# Patient Record
Sex: Female | Born: 1995 | Race: White | Hispanic: No | Marital: Single | Smoking: Never smoker
Health system: Southern US, Community
[De-identification: ages and names within clinical notes are randomized; demographics above are authoritative.]

## PROBLEM LIST (undated history)

## (undated) VITALS — BP 121/77 | HR 130 | Temp 98.1°F | Resp 16 | Ht 65.35 in | Wt 175.9 lb

## (undated) DIAGNOSIS — F429 Obsessive-compulsive disorder, unspecified: Secondary | ICD-10-CM

## (undated) DIAGNOSIS — F4310 Post-traumatic stress disorder, unspecified: Secondary | ICD-10-CM

## (undated) DIAGNOSIS — F329 Major depressive disorder, single episode, unspecified: Secondary | ICD-10-CM

## (undated) DIAGNOSIS — F32A Depression, unspecified: Secondary | ICD-10-CM

## (undated) DIAGNOSIS — F319 Bipolar disorder, unspecified: Secondary | ICD-10-CM

## (undated) DIAGNOSIS — H539 Unspecified visual disturbance: Secondary | ICD-10-CM

## (undated) HISTORY — PX: OTHER SURGICAL HISTORY: SHX169

---
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
## 2016-05-19 ENCOUNTER — Emergency Department (HOSPITAL_COMMUNITY)
Admission: EM | Admit: 2016-05-19 | Discharge: 2016-05-19 | Disposition: A | Discharge status: Home / Self Care | Payer: BLUE CROSS/BLUE SHIELD | Attending: Emergency Medicine | Admitting: Emergency Medicine

## 2016-05-19 ENCOUNTER — Encounter (HOSPITAL_COMMUNITY): Payer: Self-pay | Admitting: Emergency Medicine

## 2016-05-19 DIAGNOSIS — R45851 Suicidal ideations: Secondary | ICD-10-CM | POA: Diagnosis present

## 2016-05-19 DIAGNOSIS — F4321 Adjustment disorder with depressed mood: Secondary | ICD-10-CM

## 2016-05-19 DIAGNOSIS — Z8659 Personal history of other mental and behavioral disorders: Secondary | ICD-10-CM

## 2016-05-19 DIAGNOSIS — Z79899 Other long term (current) drug therapy: Secondary | ICD-10-CM | POA: Diagnosis not present

## 2016-05-19 HISTORY — DX: Obsessive-compulsive disorder, unspecified: F42.9

## 2016-05-19 HISTORY — DX: Bipolar disorder, unspecified: F31.9

## 2016-05-19 HISTORY — DX: Post-traumatic stress disorder, unspecified: F43.10

## 2016-05-19 LAB — COMPREHENSIVE METABOLIC PANEL
ALK PHOS: 84 U/L (ref 38–126)
ALT: 24 U/L (ref 14–54)
AST: 23 U/L (ref 15–41)
Albumin: 4.3 g/dL (ref 3.5–5.0)
Anion gap: 8 (ref 5–15)
BUN: 9 mg/dL (ref 6–20)
CO2: 24 mmol/L (ref 22–32)
CREATININE: 0.62 mg/dL (ref 0.44–1.00)
Calcium: 9.3 mg/dL (ref 8.9–10.3)
Chloride: 106 mmol/L (ref 101–111)
Glucose, Bld: 114 mg/dL — ABNORMAL HIGH (ref 65–99)
Potassium: 3.9 mmol/L (ref 3.5–5.1)
Sodium: 138 mmol/L (ref 135–145)
Total Bilirubin: <0.1 mg/dL — ABNORMAL LOW (ref 0.3–1.2)
Total Protein: 8.2 g/dL — ABNORMAL HIGH (ref 6.5–8.1)

## 2016-05-19 LAB — CBC
HCT: 41 % (ref 36.0–46.0)
Hemoglobin: 13.5 g/dL (ref 12.0–15.0)
MCH: 27.9 pg (ref 26.0–34.0)
MCHC: 32.9 g/dL (ref 30.0–36.0)
MCV: 84.7 fL (ref 78.0–100.0)
PLATELETS: 352 10*3/uL (ref 150–400)
RBC: 4.84 MIL/uL (ref 3.87–5.11)
RDW: 13.5 % (ref 11.5–15.5)
WBC: 12.7 10*3/uL — ABNORMAL HIGH (ref 4.0–10.5)

## 2016-05-19 LAB — ACETAMINOPHEN LEVEL: Acetaminophen (Tylenol), Serum: <10 ug/mL — ABNORMAL LOW (ref 10–30)

## 2016-05-19 LAB — RAPID URINE DRUG SCREEN, HOSP PERFORMED
Amphetamines: NOT DETECTED
Barbiturates: NOT DETECTED
Benzodiazepines: NOT DETECTED
Cocaine: NOT DETECTED
Opiates: NOT DETECTED
Tetrahydrocannabinol: NOT DETECTED

## 2016-05-19 LAB — I-STAT BETA HCG BLOOD, ED (MC, WL, AP ONLY): I-stat hCG, quantitative: <5 m[IU]/mL (ref <5)

## 2016-05-19 LAB — SALICYLATE LEVEL

## 2016-05-19 LAB — ETHANOL

## 2016-05-19 NOTE — ED Notes (Signed)
Patient discharged to home.  All belongings returned and signed for.  Left the unit ambulatory and was escorted to the front lobby.  Patient denies thoughts or harm to self or others.  She also denies auditory or visual hallucinations.

## 2016-05-19 NOTE — ED Triage Notes (Signed)
Pt states she never said she was suicidal and what she said was that she could not predict what the future held   Pt states when she was asked if she was suicidal she answered "I dont think so" but never said "No"  Pt states she never answers any question with a no

## 2016-05-19 NOTE — Consult Note (Signed)
[facility] Consult   Reason for Consult:  Sucidial ideations  Referring Physician:  EDP Patient Identification: Abigail Cantrell MRN:  [id number] Principal Diagnosis: Adjustment disorder with depressed mood Diagnosis:   Patient Active Problem List   Diagnosis Date Noted  . Adjustment disorder with depressed mood [F43.21] 05/19/2016    Priority: High  . Insomnia [G47.00] 11/30/2012  . OCD (obsessive compulsive disorder) [F42.9] 08/22/2012    Total Time spent with patient: 45 minutes  Subjective:   Abigail Cantrell is a 20 y.o. female patient does not warrant admission.  HPI:  20 yo female brought to the ED for suicidal ideations which she denies along with past attempts.  She reports she got upset yesterday but was not suicidal.  No homicidal ideations, hallucinations, and alcohol/drug abuse.  She has an appointment with Beverly Sessions already in place.  Stable for discharge.  Past Psychiatric History: OCD, anxiety  Risk to Self: Suicidal Ideation: No Suicidal Intent: No Is patient at risk for suicide?: No Suicidal Plan?: No Access to Means: No What has been your use of drugs/alcohol within the last 12 months?: Pt denies alcohol/drug use Other Self Harm Risks: h/o cutting Triggers for Past Attempts: Unpredictable Intentional Self Injurious Behavior: Cutting (scratching) Comment - Self Injurious Behavior: Pt reports h/o cutting. Pt states she has not cut "for a couple months". Pt scratched self pta Risk to Others: Homicidal Ideation: No Thoughts of Harm to Others: No Current Homicidal Intent: No Current Homicidal Plan: No Access to Homicidal Means: No History of harm to others?: No Assessment of Violence: None Noted Violent Behavior Description: n Does patient have access to weapons?: No Criminal Charges Pending?: No Does patient have a court date: No Prior Inpatient Therapy: Prior Inpatient Therapy: Yes Prior Therapy Dates: 1.2014 Prior Therapy  Facilty/Provider(s): [facility] Reason for Treatment: SI Prior Outpatient Therapy: Prior Outpatient Therapy: Yes Prior Therapy Dates: Multiple Dates and providers since childhood Prior Therapy Facilty/Provider(s): Multiple dates and providers since childhood Reason for Treatment: bipolar disorder  Does patient have an ACCT team?: No Does patient have Intensive In-House Services?  : No Does patient have [facility] services? : Yes Does patient have [facility] services?: No  Past Medical History:  Past Medical History:  Diagnosis Date  . Bipolar disorder ([place])   . Depression   . OCD (obsessive compulsive disorder)   . PTSD (post-traumatic stress disorder)   . Vision abnormalities     Past Surgical History:  Procedure Laterality Date  . estraction of wisdom teeth     Family History: History reviewed. No pertinent family history. Family Psychiatric  History: none Social History:  History  Alcohol Use No     History  Drug Use No    Social History   Social History  . Marital status: Single    Spouse name: N/A  . Number of children: N/A  . Years of education: N/A   Social History Main Topics  . Smoking status: Never Smoker  . Smokeless tobacco: Never Used  . Alcohol use No  . Drug use: No  . Sexual activity: Yes    Birth control/ protection: None     Comment: pt has current gf   Other Topics Concern  . None   Social History Narrative  . None   Additional Social History:    Allergies:  No Known Allergies  Labs:  Results for orders placed or performed during the hospital encounter of 05/19/16 (from the past 48 hour(s))  Comprehensive metabolic panel  Status: Abnormal   Collection Time: 05/19/16  2:01 AM  Result Value Ref Range   Sodium 138 135 - 145 mmol/L   Potassium 3.9 3.5 - 5.1 mmol/L   Chloride 106 101 - 111 mmol/L   CO2 24 22 - 32 mmol/L   Glucose, Bld 114 (H) 65 - 99 mg/dL   BUN 9 6 - 20 mg/dL   Creatinine, Ser 0.62 0.44 - 1.00 mg/dL   Calcium 9.3 8.9 - 10.3  mg/dL   Total Protein 8.2 (H) 6.5 - 8.1 g/dL   Albumin 4.3 3.5 - 5.0 g/dL   AST 23 15 - 41 U/L   ALT 24 14 - 54 U/L   Alkaline Phosphatase 84 38 - 126 U/L   Total Bilirubin <0.1 (L) 0.3 - 1.2 mg/dL   GFR calc non Af Amer >60 >60 mL/min   GFR calc Af Amer >60 >60 mL/min    Comment: (NOTE) The eGFR has been calculated using the CKD EPI equation. This calculation has not been validated in all clinical situations. eGFR's persistently <60 mL/min signify possible Chronic Kidney Disease.    Anion gap 8 5 - 15  Ethanol     Status: None   Collection Time: 05/19/16  2:01 AM  Result Value Ref Range   Alcohol, Ethyl (B) <5 <5 mg/dL    Comment:        LOWEST DETECTABLE LIMIT FOR SERUM ALCOHOL IS 5 mg/dL FOR MEDICAL PURPOSES ONLY   Salicylate level     Status: None   Collection Time: 05/19/16  2:01 AM  Result Value Ref Range   Salicylate Lvl <1.6 2.8 - 30.0 mg/dL  Acetaminophen level     Status: Abnormal   Collection Time: 05/19/16  2:01 AM  Result Value Ref Range   Acetaminophen (Tylenol), Serum <10 (L) 10 - 30 ug/mL    Comment:        THERAPEUTIC CONCENTRATIONS VARY SIGNIFICANTLY. A RANGE OF 10-30 ug/mL MAY BE AN EFFECTIVE CONCENTRATION FOR MANY PATIENTS. HOWEVER, SOME ARE BEST TREATED AT CONCENTRATIONS OUTSIDE THIS RANGE. ACETAMINOPHEN CONCENTRATIONS >150 ug/mL AT 4 HOURS AFTER INGESTION AND >50 ug/mL AT 12 HOURS AFTER INGESTION ARE OFTEN ASSOCIATED WITH TOXIC REACTIONS.   cbc     Status: Abnormal   Collection Time: 05/19/16  2:01 AM  Result Value Ref Range   WBC 12.7 (H) 4.0 - 10.5 K/uL   RBC 4.84 3.87 - 5.11 MIL/uL   Hemoglobin 13.5 12.0 - 15.0 g/dL   HCT 41.0 36.0 - 46.0 %   MCV 84.7 78.0 - 100.0 fL   MCH 27.9 26.0 - 34.0 pg   MCHC 32.9 30.0 - 36.0 g/dL   RDW 13.5 11.5 - 15.5 %   Platelets 352 150 - 400 K/uL  I-Stat beta hCG blood, ED     Status: None   Collection Time: 05/19/16  2:29 AM  Result Value Ref Range   I-stat hCG, quantitative <5.0 <5 mIU/mL    Comment 3            Comment:   GEST. AGE      CONC.  (mIU/mL)   <=1 WEEK        5 - 50     2 WEEKS       50 - 500     3 WEEKS       100 - 10,000     4 WEEKS     1,000 - 30,000        FEMALE AND  NON-PREGNANT FEMALE:     LESS THAN 5 mIU/mL   Rapid urine drug screen (hospital performed)     Status: None   Collection Time: 05/19/16  2:40 AM  Result Value Ref Range   Opiates NONE DETECTED NONE DETECTED   Cocaine NONE DETECTED NONE DETECTED   Benzodiazepines NONE DETECTED NONE DETECTED   Amphetamines NONE DETECTED NONE DETECTED   Tetrahydrocannabinol NONE DETECTED NONE DETECTED   Barbiturates NONE DETECTED NONE DETECTED    Comment:        DRUG SCREEN FOR MEDICAL PURPOSES ONLY.  IF CONFIRMATION IS NEEDED FOR ANY PURPOSE, NOTIFY LAB WITHIN 5 DAYS.        LOWEST DETECTABLE LIMITS FOR URINE DRUG SCREEN Drug Class       Cutoff (ng/mL) Amphetamine      1000 Barbiturate      200 Benzodiazepine   614 Tricyclics       431 Opiates          300 Cocaine          300 THC              50     No current facility-administered medications for this encounter.    Current Outpatient Prescriptions  Medication Sig Dispense Refill  . ibuprofen (ADVIL,MOTRIN) 800 MG tablet Take 800 mg by mouth every 8 (eight) hours as needed for moderate pain.     Marland Kitchen lamoTRIgine (LAMICTAL) 150 MG tablet Take 150 mg by mouth at bedtime.  0  . ziprasidone (GEODON) 80 MG capsule Take 80 mg by mouth every evening.  1  . citalopram (CELEXA) 20 MG tablet Take 1 tablet (20 mg total) by mouth daily. (Patient not taking: Reported on 05/19/2016) 30 tablet 3  . meloxicam (MOBIC) 7.5 MG tablet Take 1 tablet (7.5 mg total) by mouth daily as needed (headache). (Patient not taking: Reported on 09/23/2014) 30 tablet 0  . QUEtiapine (SEROQUEL) 100 MG tablet ONE TABLET BY MOUTH AT BEDTIME (Patient not taking: Reported on 09/23/2014) 30 tablet 1  . Triamcinolone Acetonide (TRIAMCINOLONE 0.1 % CREAM : EUCERIN) CREA Apply 1 application  topically 2 (two) times daily. (Patient not taking: Reported on 09/23/2014) 60 each 2  . triamcinolone cream (KENALOG) 0.1 % Apply topically 2 (two) times daily. NEED VISIT! (Patient not taking: Reported on 09/23/2014) 60 g 0    Musculoskeletal: Strength & Muscle Tone: within normal limits Gait & Station: normal Patient leans: N/A  Psychiatric Specialty Exam: Physical Exam  Constitutional: She is oriented to person, place, and time. She appears well-developed and well-nourished.  HENT:  Head: Normocephalic.  Neck: Normal range of motion.  Respiratory: Effort normal.  Musculoskeletal: Normal range of motion.  Neurological: She is alert and oriented to person, place, and time.  Skin: Skin is warm and dry.  Psychiatric: Her speech is normal and behavior is normal. Judgment and thought content normal. Cognition and memory are normal. She exhibits a depressed mood.    Review of Systems  Constitutional: Negative.   HENT: Negative.   Eyes: Negative.   Respiratory: Negative.   Cardiovascular: Negative.   Gastrointestinal: Negative.   Genitourinary: Negative.   Musculoskeletal: Negative.   Skin: Negative.   Neurological: Negative.   Endo/Heme/Allergies: Negative.   Psychiatric/Behavioral: Positive for depression.    Blood pressure 110/56, pulse 104, temperature 98.1 F (36.7 C), temperature source Oral, resp. rate 18, height '5\' 6"'$  (1.676 m), weight 100.2 kg (221 lb), last menstrual period 04/04/2016, SpO2 98 %.Body mass index is  35.67 kg/m.  General Appearance: Disheveled  Eye Contact:  Good  Speech:  Normal Rate  Volume:  Normal  Mood:  Depressed, mild  Affect:  Congruent  Thought Process:  Coherent and Descriptions of Associations: Intact  Orientation:  Full (Time, Place, and Person)  Thought Content:  WDL  Suicidal Thoughts:  No  Homicidal Thoughts:  No  Memory:  Immediate;   Good Recent;   Good Remote;   Good  Judgement:  Fair  Insight:  Good  Psychomotor Activity:   Normal  Concentration:  Concentration: Good and Attention Span: Good  Recall:  Good  Fund of Knowledge:  Good  Language:  Good  Akathisia:  No  Handed:  Right  AIMS (if indicated):     Assets:  Housing Leisure Time Physical Health Resilience Social Support  ADL's:  Intact  Cognition:  WNL  Sleep:        Treatment Plan Summary: Daily contact with patient to assess and evaluate symptoms and progress in treatment, Medication management and Plan adjustment disorder with depressed mood:  -Crisis stabilization -Medication management:  Continued medical medications along with Geodon 80 mg daily for mood stabilization along with Lamictal 150 mg daily. -Individual counseling  Disposition: No evidence of imminent risk to self or others at present.    Waylan Boga, NP 05/19/2016 9:47 AM  Patient seen face-to-face for psychiatric evaluation, chart reviewed and case discussed with the physician extender and developed treatment plan. Reviewed the information documented and agree with the treatment plan. Corena Pilgrim, MD

## 2016-05-19 NOTE — ED Notes (Signed)
MD at bedside. 

## 2016-05-19 NOTE — Discharge Instructions (Signed)
For your ongoing mental health needs, you are advised to follow up with [facility].  If you do not currently have an appointment, new and returning patients are seen at their walk-in clinic.  Walk-in hours are Monday - Friday from 8:00 am - 3:00 pm.  Walk-in patients are seen on a first come, first served basis.  Try to arrive as early as possible for he best chance of being seen the same day: ° °     [facility] °     [address] °     [place], [place] [address] °     [phone] °

## 2016-05-19 NOTE — [facility] Suicide Risk Assessment (Signed)
Suicide Risk Assessment  Discharge Assessment   [facility]   Principal Problem: Adjustment disorder with depressed mood Discharge Diagnoses:  Patient Active Problem List   Diagnosis Date Noted  . Adjustment disorder with depressed mood [F43.21] 05/19/2016    Priority: High  . Insomnia [G47.00] 11/30/2012  . OCD (obsessive compulsive disorder) [F42.9] 08/22/2012    Total Time spent with patient: 45 minutes  Musculoskeletal: Strength & Muscle Tone: within normal limits Gait & Station: normal Patient leans: N/A  Psychiatric Specialty Exam: Physical Exam  Constitutional: She is oriented to person, place, and time. She appears well-developed and well-nourished.  HENT:  Head: Normocephalic.  Neck: Normal range of motion.  Respiratory: Effort normal.  Musculoskeletal: Normal range of motion.  Neurological: She is alert and oriented to person, place, and time.  Skin: Skin is warm and dry.  Psychiatric: Her speech is normal and behavior is normal. Judgment and thought content normal. Cognition and memory are normal. She exhibits a depressed mood.    Review of Systems  Constitutional: Negative.   HENT: Negative.   Eyes: Negative.   Respiratory: Negative.   Cardiovascular: Negative.   Gastrointestinal: Negative.   Genitourinary: Negative.   Musculoskeletal: Negative.   Skin: Negative.   Neurological: Negative.   Endo/Heme/Allergies: Negative.   Psychiatric/Behavioral: Positive for depression.    Blood pressure 110/56, pulse 104, temperature 98.1 F (36.7 C), temperature source Oral, resp. rate 18, height 5\' 6"  (1.676 m), weight 100.2 kg (221 lb), last menstrual period 04/04/2016, SpO2 98 %.Body mass index is 35.67 kg/m.  General Appearance: Disheveled  Eye Contact:  Good  Speech:  Normal Rate  Volume:  Normal  Mood:  Depressed, mild  Affect:  Congruent  Thought Process:  Coherent and Descriptions of Associations: Intact  Orientation:  Full  (Time, Place, and Person)  Thought Content:  WDL  Suicidal Thoughts:  No  Homicidal Thoughts:  No  Memory:  Immediate;   Good Recent;   Good Remote;   Good  Judgement:  Fair  Insight:  Good  Psychomotor Activity:  Normal  Concentration:  Concentration: Good and Attention Span: Good  Recall:  Good  Fund of Knowledge:  Good  Language:  Good  Akathisia:  No  Handed:  Right  AIMS (if indicated):     Assets:  Housing Leisure Time Physical Health Resilience Social Support  ADL's:  Intact  Cognition:  WNL  Sleep:       Mental Status Per Nursing Assessment::   On Admission:    Suicidal ideations  Demographic Factors:  Adolescent or young adult and Caucasian  Loss Factors: NA  Historical Factors: NA  Risk Reduction Factors:   Sense of responsibility to family, Living with another person, especially a relative, Positive social support and Positive therapeutic relationship  Continued Clinical Symptoms:  Depression, mild  Cognitive Features That Contribute To Risk:  None    Suicide Risk:  Minimal: No identifiable suicidal ideation.  Patients presenting with no risk factors but with morbid ruminations; may be classified as minimal risk based on the severity of the depressive symptoms    Plan Of Care/Follow-up recommendations:  Activity:  as tolerated Diet:  heart healthy diet  Marcey Persad, NP 05/19/2016, 9:53 AM
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
## 2016-05-19 NOTE — ED Provider Notes (Signed)
By signing my name below, I, Emmanuella Mensah, attest that this documentation has been prepared under the direction and in the presence of Jezabelle Chisolm N Marsha Gundlach, DO. Electronically Signed: Angelene GiovanniEmmanuella Mensah, ED Scribe. 05/19/16. 2:17 AM.  TIME SEEN: 1:54 AM   CHIEF COMPLAINT: Agitation   HPI: Abigail Cantrell is a 20 y.o. female with a hx of Major depressive disorder and OCD brought in by [facility], who presents to the Emergency Department for evaluation for possible SI s/p an episode of scratching her bilateral forearms that occurred PTA. Pt reports that her friends called [facility] because she scratched herself but states that she did not endorse SI. She adds that "always has SI". Per nursing note, pt endorsed SI while with her friend and that is why her friend called [facility] but pt denies that she did. She reports a hx of suicide attempt last year but states that she does not remember how she did it or what happened. No alleviating factors noted. She denies any ETOH or illicit drug use PTA. She denies any HI, auditory/visual hallucinations or any other symptoms.  Patient is poorly cooperative with exam and history stating repeatedly that she wants to go home.  ROS: See HPI Constitutional: no fever  Eyes: no drainage  ENT: no runny nose   Cardiovascular:  no chest pain  Resp: no SOB  GI: no vomiting GU: no dysuria Integumentary: no rash  Allergy: no hives  Musculoskeletal: no leg swelling  Neurological: no slurred speech ROS otherwise negative  PAST MEDICAL HISTORY/PAST SURGICAL HISTORY:  Past Medical History:  Diagnosis Date  . Bipolar disorder (HCC)   . Depression   . OCD (obsessive compulsive disorder)   . PTSD (post-traumatic stress disorder)   . Vision abnormalities     MEDICATIONS:  Prior to Admission medications   Medication Sig Start Date End Date Taking? Authorizing Provider  ibuprofen (ADVIL,MOTRIN) 800 MG tablet Take 800 mg by mouth every 8 (eight) hours as needed for moderate pain.   11/22/12  Yes Historical Provider, MD  lamoTRIgine (LAMICTAL) 150 MG tablet Take 150 mg by mouth at bedtime. 04/15/16  Yes Historical Provider, MD  ziprasidone (GEODON) 80 MG capsule Take 80 mg by mouth every evening. 05/07/16  Yes Historical Provider, MD  citalopram (CELEXA) 20 MG tablet Take 1 tablet (20 mg total) by mouth daily. Patient not taking: Reported on 05/19/2016 12/28/12   Tonye Pearsonobert P Doolittle, MD  meloxicam (MOBIC) 7.5 MG tablet Take 1 tablet (7.5 mg total) by mouth daily as needed (headache). Patient not taking: Reported on 09/23/2014 03/29/13   Tonye Pearsonobert P Doolittle, MD  QUEtiapine (SEROQUEL) 100 MG tablet ONE TABLET BY MOUTH AT BEDTIME Patient not taking: Reported on 09/23/2014 04/11/13   Tonye Pearsonobert P Doolittle, MD  Triamcinolone Acetonide (TRIAMCINOLONE 0.1 % CREAM : EUCERIN) CREA Apply 1 application topically 2 (two) times daily. Patient not taking: Reported on 09/23/2014 03/01/13   Tonye Pearsonobert P Doolittle, MD  triamcinolone cream (KENALOG) 0.1 % Apply topically 2 (two) times daily. NEED VISIT! Patient not taking: Reported on 09/23/2014 04/24/13   Godfrey PickEleanore E Egan, PA-C    ALLERGIES:  No Known Allergies  SOCIAL HISTORY:  Social History  Substance Use Topics  . Smoking status: Never Smoker  . Smokeless tobacco: Never Used  . Alcohol use No    FAMILY HISTORY: History reviewed. No pertinent family history.  EXAM: BP 130/91 (BP Location: Left Arm)   Pulse 98   Temp 98.1 F (36.7 C) (Oral)   Resp 20  Ht 5\' 6"  (1.676 m)   Wt 221 lb (100.2 kg)   LMP 04/04/2016 (Approximate)   SpO2 96%   BMI 35.67 kg/m  CONSTITUTIONAL: Alert and oriented and responds appropriately to questions. Well-appearing; well-nourished HEAD: Normocephalic EYES: Conjunctivae clear, PERRL ENT: normal nose; no rhinorrhea; moist mucous membranes NECK: Supple, no meningismus, no LAD  CARD: RRR; S1 and S2 appreciated; no murmurs, no clicks, no rubs, no gallops RESP: Normal chest excursion without splinting or tachypnea;  breath sounds clear and equal bilaterally; no wheezes, no rhonchi, no rales, no hypoxia or respiratory distress, speaking full sentences ABD/GI: Normal bowel sounds; non-distended; soft, non-tender, no rebound, no guarding, no peritoneal signs BACK:  The back appears normal and is non-tender to palpation, there is no CVA tenderness EXT: Normal ROM in all joints; non-tender to palpation; no edema; normal capillary refill; no cyanosis, no calf tenderness or swelling    SKIN: Normal color for age and race; warm; no rash; multiple superficial abrasions to BUE NEURO: Moves all extremities equally, sensation to light touch intact diffusely, cranial nerves II through XII intact PSYCH: Pt appears very agitation, difficult to redirect, rapid loud speech, poor incite, endorses chronic SI without plan, no HI or hallucination.   MEDICAL DECISION MAKING: Patient here with agitation, suicidal thoughts without plan. Repeatedly requesting to go home. This time does agree to be stable on tear labrum if she decides to leave I do feel she will need to be involuntarily committed and I have discussed this with the patient. She reports that the suicidal thoughts or something that are chronic for her but given her increased agitation, impulsivity tonight on top of the fact that she has had previous suicide attempts, I feel she needs psychiatric evaluation for further disposition. Currently no medical complaints. Nicole Kindred is up-to-date. We'll obtain screening labs and urine.  ED PROGRESS: 3:45 AM  Labs and urine are unremarkable.  Pt is medically cleared. This time she agrees to stay voluntarily.  Awaiting TTS evaluation.    I personally performed the services described in this documentation, which was scribed in my presence. The recorded information has been reviewed and is accurate.    Layla Maw Calel Pisarski, DO 05/19/16 [phone]

## 2016-05-19 NOTE — ED Notes (Signed)
Pt presents for evaluation after break up with boyfriend.  Self inflicted scratches to right forearm noted.  Pt also reports a medication change recently.  Pt cooperative but anxious.  Pt admits to history of Bipolar and OCD.  Denies SI, HI or AVH.  Denies feeling hopeless.  A&O x 3, no distress noted.  Monitoring for safety, Q 15 min checks in effect.

## 2016-05-19 NOTE — BH Assessment (Signed)
[facility] Assessment Progress Note  Per Thedore MinsMojeed Akintayo, MD, this pt does not require psychiatric hospitalization at this time.  [facility]  This has been included in pt's discharge instructions.  Pt's nurse, Dawnaly, has been notified.  Doylene Canninghomas Vondell Babers, MA Triage Specialist [phone]
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
## 2016-07-08 ENCOUNTER — Ambulatory Visit (HOSPITAL_COMMUNITY)
Admission: EM | Admit: 2016-07-08 | Discharge: 2016-07-08 | Disposition: A | Discharge status: Home / Self Care | Payer: BLUE CROSS/BLUE SHIELD | Attending: Family Medicine | Admitting: Family Medicine

## 2016-07-08 ENCOUNTER — Encounter (HOSPITAL_COMMUNITY): Payer: Self-pay | Admitting: Emergency Medicine

## 2016-07-08 DIAGNOSIS — R197 Diarrhea, unspecified: Secondary | ICD-10-CM

## 2016-07-08 DIAGNOSIS — N390 Urinary tract infection, site not specified: Secondary | ICD-10-CM | POA: Diagnosis not present

## 2016-07-08 LAB — POCT I-STAT, CHEM 8
BUN: 4 mg/dL — AB (ref 6–20)
CHLORIDE: 103 mmol/L (ref 101–111)
Calcium, Ion: 1.16 mmol/L (ref 1.15–1.40)
Creatinine, Ser: 0.7 mg/dL (ref 0.44–1.00)
Glucose, Bld: 90 mg/dL (ref 65–99)
HEMATOCRIT: 47 % — AB (ref 36.0–46.0)
Hemoglobin: 16 g/dL — ABNORMAL HIGH (ref 12.0–15.0)
Potassium: 3.9 mmol/L (ref 3.5–5.1)
SODIUM: 139 mmol/L (ref 135–145)
TCO2: 23 mmol/L (ref 0–100)

## 2016-07-08 LAB — POCT URINALYSIS DIP (DEVICE)
GLUCOSE, UA: 100 mg/dL — AB
LEUKOCYTES UA: NEGATIVE
Nitrite: POSITIVE — AB
Protein, ur: 100 mg/dL — AB
SPECIFIC GRAVITY, URINE: 1.025 (ref 1.005–1.030)
UROBILINOGEN UA: 4 mg/dL — AB (ref 0.0–1.0)
pH: 6 (ref 5.0–8.0)

## 2016-07-08 LAB — POCT PREGNANCY, URINE: PREG TEST UR: NEGATIVE

## 2016-07-08 MED ORDER — DIPHENOXYLATE-ATROPINE 2.5-0.025 MG PO TABS: 1.0000 | ORAL_TABLET | Freq: Four times a day (QID) | ORAL | 0 refills | Status: AC | PRN

## 2016-07-08 MED ORDER — CEPHALEXIN 500 MG PO CAPS: 500.0000 mg | ORAL_CAPSULE | Freq: Four times a day (QID) | ORAL | 0 refills | Status: DC

## 2016-07-08 NOTE — Discharge Instructions (Signed)
See your Physician for recheck in 2-3 days if symptoms do not improve

## 2016-07-08 NOTE — ED Triage Notes (Signed)
[facility] a complaint of abdominal pain and diarrhea that started 5 days ago.

## 2016-07-08 NOTE — ED Provider Notes (Signed)
CSN: [id number]     Arrival date & time 07/08/16  1334 History   None    Chief Complaint  Patient presents with  . Abdominal Pain   (Consider location/radiation/quality/duration/timing/severity/associated sxs/prior Treatment) The history is provided by the patient. No language interpreter was used.  Abdominal Pain  Pain location:  Generalized Pain quality: aching   Pain radiates to:  Does not radiate Pain severity:  Moderate Onset quality:  Gradual Timing:  Constant Progression:  Worsening Chronicity:  New Relieved by:  Nothing Ineffective treatments:  None tried Associated symptoms: nausea   Risk factors: not pregnant   Pt complains of diarrhea on and off for 5 days.  Pt has some discomfort with urination.  Pt reports symptoms began after working in [employer] at her job.  Past Medical History:  Diagnosis Date  . Bipolar disorder (HCC)   . Depression   . OCD (obsessive compulsive disorder)   . PTSD (post-traumatic stress disorder)   . Vision abnormalities    Past Surgical History:  Procedure Laterality Date  . estraction of wisdom teeth     History reviewed. No pertinent family history. Social History  Substance Use Topics  . Smoking status: Never Smoker  . Smokeless tobacco: Never Used  . Alcohol use No   OB History    No data available     Review of Systems  Gastrointestinal: Positive for abdominal pain and nausea.  All other systems reviewed and are negative.   Allergies  Patient has no known allergies.  Home Medications   Prior to Admission medications   Medication Sig Start Date End Date Taking? Authorizing Provider  buPROPion (WELLBUTRIN) 75 MG tablet Take 75 mg by mouth 2 (two) times daily.   Yes Historical Provider, MD  lamoTRIgine (LAMICTAL) 150 MG tablet Take 150 mg by mouth at bedtime. 04/15/16  Yes Historical Provider, MD  cephALEXin (KEFLEX) 500 MG capsule Take 1 capsule (500 mg total) by mouth 4 (four) times daily. 07/08/16   Elson AreasLeslie K Sofia,  PA-C  citalopram (CELEXA) 20 MG tablet Take 1 tablet (20 mg total) by mouth daily. Patient not taking: Reported on 07/08/2016 12/28/12   Tonye Pearsonobert P Doolittle, MD  diphenoxylate-atropine (LOMOTIL) 2.5-0.025 MG tablet Take 1 tablet by mouth 4 (four) times daily as needed for diarrhea or loose stools. 07/08/16   Elson AreasLeslie K Sofia, PA-C  ibuprofen (ADVIL,MOTRIN) 800 MG tablet Take 800 mg by mouth every 8 (eight) hours as needed for moderate pain.  11/22/12   Historical Provider, MD  meloxicam (MOBIC) 7.5 MG tablet Take 1 tablet (7.5 mg total) by mouth daily as needed (headache). Patient not taking: Reported on 07/08/2016 03/29/13   Tonye Pearsonobert P Doolittle, MD  QUEtiapine (SEROQUEL) 100 MG tablet ONE TABLET BY MOUTH AT BEDTIME Patient not taking: Reported on 07/08/2016 04/11/13   Tonye Pearsonobert P Doolittle, MD  Triamcinolone Acetonide (TRIAMCINOLONE 0.1 % CREAM : EUCERIN) CREA Apply 1 application topically 2 (two) times daily. Patient not taking: Reported on 07/08/2016 03/01/13   Tonye Pearsonobert P Doolittle, MD  triamcinolone cream (KENALOG) 0.1 % Apply topically 2 (two) times daily. NEED VISIT! Patient not taking: Reported on 07/08/2016 04/24/13   Godfrey PickEleanore E Egan, PA-C  ziprasidone (GEODON) 80 MG capsule Take 80 mg by mouth every evening. 05/07/16   Historical Provider, MD   Meds Ordered and Administered this Visit  Medications - No data to display  BP 136/90 (BP Location: Left Arm)   Pulse 109   Temp 98.5 F (36.9 C) (Oral)   Resp  16   LMP 06/24/2016 (Exact Date)   SpO2 100%  No data found.   Physical Exam  Constitutional: She is oriented to person, place, and time. She appears well-developed and well-nourished.  HENT:  Head: Normocephalic.  Eyes: EOM are normal.  Neck: Normal range of motion.  Pulmonary/Chest: Effort normal.  Abdominal: Soft. Bowel sounds are normal. She exhibits no distension. There is no tenderness. There is no guarding.  Musculoskeletal: Normal range of motion.  Neurological: She is alert and  oriented to person, place, and time.  Psychiatric: She has a normal mood and affect.  Nursing note and vitals reviewed.   Urgent Care Course   Clinical Course     Procedures (including critical care time)  Labs Review Labs Reviewed  POCT URINALYSIS DIP (DEVICE) - Abnormal; Notable for the following:       Result Value   Glucose, UA 100 (*)    Bilirubin Urine LARGE (*)    Ketones, ur TRACE (*)    Hgb urine dipstick TRACE (*)    Protein, ur 100 (*)    Urobilinogen, UA 4.0 (*)    Nitrite POSITIVE (*)    All other components within normal limits  POCT I-STAT, CHEM 8 - Abnormal; Notable for the following:    BUN 4 (*)    Hemoglobin 16.0 (*)    HCT 47.0 (*)    All other components within normal limits  POCT PREGNANCY, URINE    Imaging Review No results found.   Visual Acuity Review  Right Eye Distance:   Left Eye Distance:   Bilateral Distance:    Right Eye Near:   Left Eye Near:    Bilateral Near:         MDM     1. Diarrhea, unspecified type   2. Urinary tract infection without hematuria, site unspecified    An After Visit Summary was printed and given to the patient. Meds ordered this encounter  Medications  . buPROPion (WELLBUTRIN) 75 MG tablet    Sig: Take 75 mg by mouth 2 (two) times daily.  . diphenoxylate-atropine (LOMOTIL) 2.5-0.025 MG tablet    Sig: Take 1 tablet by mouth 4 (four) times daily as needed for diarrhea or loose stools.    Dispense:  8 tablet    Refill:  0    Order Specific Question:   Supervising Provider    Answer:   Linna HoffKINDL, JAMES D [phone]]  . cephALEXin (KEFLEX) 500 MG capsule    Sig: Take 1 capsule (500 mg total) by mouth 4 (four) times daily.    Dispense:  40 capsule    Refill:  0    Order Specific Question:   Supervising Provider    Answer:   Bradd CanaryKINDL, JAMES D [[id number]]     Lonia SkinnerLeslie K GlencoeSofia, PA-C 07/08/16 [phone]

## 2017-02-23 ENCOUNTER — Other Ambulatory Visit: Payer: Self-pay | Admitting: Gynecology

## 2017-02-23 DIAGNOSIS — N644 Mastodynia: Secondary | ICD-10-CM

## 2017-02-28 ENCOUNTER — Ambulatory Visit
Admission: RE | Admit: 2017-02-28 | Discharge: 2017-02-28 | Disposition: A | Discharge status: Home / Self Care | Payer: 59 | Source: Ambulatory Visit | Attending: Gynecology | Admitting: Gynecology

## 2017-02-28 DIAGNOSIS — N644 Mastodynia: Secondary | ICD-10-CM

## 2017-04-15 ENCOUNTER — Emergency Department (HOSPITAL_COMMUNITY)
Admission: EM | Admit: 2017-04-15 | Discharge: 2017-04-16 | Disposition: A | Discharge status: Home / Self Care | Payer: 59 | Attending: Emergency Medicine | Admitting: Emergency Medicine

## 2017-04-15 ENCOUNTER — Encounter (HOSPITAL_COMMUNITY): Payer: Self-pay

## 2017-04-15 DIAGNOSIS — S0181XA Laceration without foreign body of other part of head, initial encounter: Secondary | ICD-10-CM | POA: Insufficient documentation

## 2017-04-15 DIAGNOSIS — Y999 Unspecified external cause status: Secondary | ICD-10-CM | POA: Insufficient documentation

## 2017-04-15 DIAGNOSIS — Y929 Unspecified place or not applicable: Secondary | ICD-10-CM | POA: Insufficient documentation

## 2017-04-15 DIAGNOSIS — Z79899 Other long term (current) drug therapy: Secondary | ICD-10-CM | POA: Insufficient documentation

## 2017-04-15 DIAGNOSIS — W228XXA Striking against or struck by other objects, initial encounter: Secondary | ICD-10-CM | POA: Insufficient documentation

## 2017-04-15 DIAGNOSIS — Y9389 Activity, other specified: Secondary | ICD-10-CM | POA: Insufficient documentation

## 2017-04-15 DIAGNOSIS — Z23 Encounter for immunization: Secondary | ICD-10-CM | POA: Insufficient documentation

## 2017-04-15 MED ORDER — TETANUS-DIPHTH-ACELL PERTUSSIS 5-2.5-18.5 LF-MCG/0.5 IM SUSP
0.5000 mL | Freq: Once | INTRAMUSCULAR | Status: AC
  Administered 2017-04-15: 0.5 mL via INTRAMUSCULAR
  Filled 2017-04-15: qty 0.5
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
## 2017-04-15 NOTE — Discharge Instructions (Signed)
Please read attached information regarding wound care. Return to ED for additional injury, signs of infection including redness, drainage from site, fevers.

## 2017-04-28 ENCOUNTER — Emergency Department (HOSPITAL_BASED_OUTPATIENT_CLINIC_OR_DEPARTMENT_OTHER): Payer: 59

## 2017-04-28 ENCOUNTER — Encounter (HOSPITAL_BASED_OUTPATIENT_CLINIC_OR_DEPARTMENT_OTHER): Payer: Self-pay | Admitting: Emergency Medicine

## 2017-04-28 ENCOUNTER — Emergency Department (HOSPITAL_BASED_OUTPATIENT_CLINIC_OR_DEPARTMENT_OTHER)
Admission: EM | Admit: 2017-04-28 | Discharge: 2017-04-28 | Disposition: A | Discharge status: Home / Self Care | Payer: 59 | Attending: Emergency Medicine | Admitting: Emergency Medicine

## 2017-04-28 DIAGNOSIS — S161XXA Strain of muscle, fascia and tendon at neck level, initial encounter: Secondary | ICD-10-CM

## 2017-04-28 DIAGNOSIS — Y939 Activity, unspecified: Secondary | ICD-10-CM | POA: Diagnosis not present

## 2017-04-28 DIAGNOSIS — T148XXA Other injury of unspecified body region, initial encounter: Secondary | ICD-10-CM | POA: Insufficient documentation

## 2017-04-28 DIAGNOSIS — S6991XA Unspecified injury of right wrist, hand and finger(s), initial encounter: Secondary | ICD-10-CM | POA: Diagnosis present

## 2017-04-28 DIAGNOSIS — Z79899 Other long term (current) drug therapy: Secondary | ICD-10-CM | POA: Diagnosis not present

## 2017-04-28 DIAGNOSIS — Y999 Unspecified external cause status: Secondary | ICD-10-CM | POA: Diagnosis not present

## 2017-04-28 DIAGNOSIS — S63601A Unspecified sprain of right thumb, initial encounter: Secondary | ICD-10-CM | POA: Diagnosis not present

## 2017-04-28 DIAGNOSIS — Y929 Unspecified place or not applicable: Secondary | ICD-10-CM | POA: Insufficient documentation

## 2017-04-28 DIAGNOSIS — T07XXXA Unspecified multiple injuries, initial encounter: Secondary | ICD-10-CM

## 2017-04-28 MED ORDER — NAPROXEN 500 MG PO TABS: 500.0000 mg | ORAL_TABLET | Freq: Two times a day (BID) | ORAL | 0 refills | Status: AC
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
## 2017-04-28 NOTE — ED Notes (Signed)
ED Provider at bedside. 

## 2017-04-28 NOTE — Discharge Instructions (Signed)
Wash abrasions daily with soap and water and apply antibiotic ointment. Wear the Velcro short arm wrist and thumb splint. Follow-up with sports medicine: Make an appointment. Take Naprosyn as needed for pain. Expect to be stiff and sore the next 2 days. Return for development of abdominal pain or persistent vomiting. Work note provided.

## 2017-05-03 ENCOUNTER — Encounter: Payer: Self-pay | Admitting: Family Medicine

## 2017-05-03 ENCOUNTER — Ambulatory Visit (INDEPENDENT_AMBULATORY_CARE_PROVIDER_SITE_OTHER): Payer: Self-pay | Admitting: Family Medicine

## 2017-05-03 ENCOUNTER — Ambulatory Visit (HOSPITAL_BASED_OUTPATIENT_CLINIC_OR_DEPARTMENT_OTHER)
Admission: RE | Admit: 2017-05-03 | Discharge: 2017-05-03 | Disposition: A | Discharge status: Home / Self Care | Payer: 59 | Source: Ambulatory Visit | Attending: Family Medicine | Admitting: Family Medicine

## 2017-05-03 VITALS — BP 132/86 | HR 76 | Ht 66.0 in | Wt 212.0 lb

## 2017-05-03 DIAGNOSIS — S6991XA Unspecified injury of right wrist, hand and finger(s), initial encounter: Secondary | ICD-10-CM

## 2017-05-03 DIAGNOSIS — S298XXA Other specified injuries of thorax, initial encounter: Secondary | ICD-10-CM

## 2017-05-03 DIAGNOSIS — M25571 Pain in right ankle and joints of right foot: Secondary | ICD-10-CM

## 2017-05-03 DIAGNOSIS — R0781 Pleurodynia: Secondary | ICD-10-CM | POA: Insufficient documentation

## 2017-05-03 DIAGNOSIS — S20211A Contusion of right front wall of thorax, initial encounter: Secondary | ICD-10-CM

## 2017-05-03 DIAGNOSIS — R0789 Other chest pain: Secondary | ICD-10-CM

## 2017-05-03 DIAGNOSIS — S199XXA Unspecified injury of neck, initial encounter: Secondary | ICD-10-CM

## 2017-05-03 MED ORDER — METAXALONE 800 MG PO TABS: 800.0000 mg | ORAL_TABLET | Freq: Three times a day (TID) | ORAL | 1 refills | Status: DC

## 2017-05-03 MED ORDER — METHOCARBAMOL 500 MG PO TABS: 500.0000 mg | ORAL_TABLET | Freq: Three times a day (TID) | ORAL | 1 refills | Status: AC | PRN

## 2017-05-03 NOTE — Patient Instructions (Signed)
We will go ahead with an MRI of your cervical spine to assess for ligamentous damage, possible disc herniation. Aleve 2 tabs twice a day with food for pain and inflammation. Skelaxin three times a day as needed for muscle spasms (do not drive with this if it makes you sleepy). Consider cervical collar if severely painful. Wait on motion exercises and therapy until we get the MRI results. Heat 15 minutes at a time 3-4 times a day to help with spasms. Watch head position when on computers, texting, when sleeping in bed - should in line with back to prevent further spasms and pain.  Your wrist and hand pain are consistent with a sprain but we couldn't rule out an occult fracture at this point. Wear thumb spica brace at all times except to wash area, ice this 15 minutes at a time 3-4 times a day. Follow up with me in 1 week. We will repeat your evaluation and x-rays of the wrist at that time.  Get x-rays downstairs of your ribs though this is likely due to a contusion based on your exam - we will call you with the results.  Your ankle pain is due to a contusion also. Consider laceup ankle brace if this feels more supportive. Start ankle strengthening exercises with theraband.

## 2017-05-05 ENCOUNTER — Emergency Department (HOSPITAL_BASED_OUTPATIENT_CLINIC_OR_DEPARTMENT_OTHER)
Admission: EM | Admit: 2017-05-05 | Discharge: 2017-05-05 | Disposition: A | Discharge status: Home / Self Care | Payer: 59 | Attending: Emergency Medicine | Admitting: Emergency Medicine

## 2017-05-05 ENCOUNTER — Encounter: Payer: Self-pay | Admitting: Family Medicine

## 2017-05-05 ENCOUNTER — Ambulatory Visit (INDEPENDENT_AMBULATORY_CARE_PROVIDER_SITE_OTHER): Payer: Self-pay | Admitting: Family Medicine

## 2017-05-05 DIAGNOSIS — S199XXD Unspecified injury of neck, subsequent encounter: Secondary | ICD-10-CM

## 2017-05-05 DIAGNOSIS — S20211A Contusion of right front wall of thorax, initial encounter: Secondary | ICD-10-CM | POA: Insufficient documentation

## 2017-05-05 DIAGNOSIS — S161XXA Strain of muscle, fascia and tendon at neck level, initial encounter: Secondary | ICD-10-CM

## 2017-05-05 DIAGNOSIS — Z79899 Other long term (current) drug therapy: Secondary | ICD-10-CM | POA: Insufficient documentation

## 2017-05-05 DIAGNOSIS — Y939 Activity, unspecified: Secondary | ICD-10-CM | POA: Insufficient documentation

## 2017-05-05 DIAGNOSIS — S6991XD Unspecified injury of right wrist, hand and finger(s), subsequent encounter: Secondary | ICD-10-CM | POA: Insufficient documentation

## 2017-05-05 DIAGNOSIS — Y999 Unspecified external cause status: Secondary | ICD-10-CM | POA: Diagnosis not present

## 2017-05-05 DIAGNOSIS — M62838 Other muscle spasm: Secondary | ICD-10-CM | POA: Diagnosis not present

## 2017-05-05 DIAGNOSIS — M25571 Pain in right ankle and joints of right foot: Secondary | ICD-10-CM | POA: Insufficient documentation

## 2017-05-05 DIAGNOSIS — Y929 Unspecified place or not applicable: Secondary | ICD-10-CM | POA: Insufficient documentation

## 2017-05-05 DIAGNOSIS — M542 Cervicalgia: Secondary | ICD-10-CM
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
## 2017-05-05 NOTE — Assessment & Plan Note (Signed)
Independently reviewed CT - obvious lack of lordosis.  Small chip fracture vs osseus fragment noted posterosuperior C5.  Her pain level today and motion would not suggest obvious ligamentous instability but given CT findings advised we go ahead with MRI to assess for this and possible disc herniation.  Aleve, skelaxin.  Discussed cervical collar.  No PT/motion exercises until get the MRI.  Heat for spasms.  Discussed ergonomic issues.

## 2017-05-05 NOTE — Addendum Note (Signed)
Addended by: Kathi Simpers F on: 05/05/2017 04:17 PM   Modules accepted: Orders
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
## 2017-05-05 NOTE — Assessment & Plan Note (Signed)
2/2 contusion.  No ligamentous laxity, exam otherwise normal.
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
## 2017-05-05 NOTE — Assessment & Plan Note (Signed)
likely 2/2 sprain.  Independently reviewed radiographs and no evidence fracture but she does have tenderness that includes the scaphoid.  Switch to thumb spica brace, f/u in 1 week to reevaluate and repeat radiographs.  Consider MRI if this is still normal and has tenderness at snuffbox.

## 2017-05-05 NOTE — Assessment & Plan Note (Signed)
ordered and independently reviewed radiographs of ribs and no evidence fracture.  Patient was reassured.

## 2017-05-07 ENCOUNTER — Ambulatory Visit (HOSPITAL_BASED_OUTPATIENT_CLINIC_OR_DEPARTMENT_OTHER)
Admission: RE | Admit: 2017-05-07 | Discharge: 2017-05-07 | Disposition: A | Discharge status: Home / Self Care | Payer: 59 | Source: Ambulatory Visit | Attending: Family Medicine | Admitting: Family Medicine

## 2017-05-07 DIAGNOSIS — R937 Abnormal findings on diagnostic imaging of other parts of musculoskeletal system: Secondary | ICD-10-CM | POA: Diagnosis not present

## 2017-05-07 DIAGNOSIS — M542 Cervicalgia: Secondary | ICD-10-CM

## 2017-05-10 ENCOUNTER — Ambulatory Visit (HOSPITAL_BASED_OUTPATIENT_CLINIC_OR_DEPARTMENT_OTHER)
Admission: RE | Admit: 2017-05-10 | Discharge: 2017-05-10 | Disposition: A | Discharge status: Home / Self Care | Payer: No Typology Code available for payment source | Source: Ambulatory Visit | Attending: Family Medicine | Admitting: Family Medicine

## 2017-05-10 ENCOUNTER — Encounter: Payer: Self-pay | Admitting: Family Medicine

## 2017-05-10 ENCOUNTER — Ambulatory Visit (INDEPENDENT_AMBULATORY_CARE_PROVIDER_SITE_OTHER): Payer: Self-pay | Admitting: Family Medicine

## 2017-05-10 VITALS — BP 131/86 | HR 88 | Ht 66.0 in | Wt 214.0 lb

## 2017-05-10 DIAGNOSIS — M25531 Pain in right wrist: Secondary | ICD-10-CM

## 2017-05-10 DIAGNOSIS — S6991XD Unspecified injury of right wrist, hand and finger(s), subsequent encounter: Secondary | ICD-10-CM

## 2017-05-10 DIAGNOSIS — S199XXD Unspecified injury of neck, subsequent encounter: Secondary | ICD-10-CM

## 2017-05-10 NOTE — Patient Instructions (Signed)
Your x-rays look great of your wrist. Stop using the thumb spica brace now. Do wrist circles, flexion/extension exercises. Expect some soreness for the next few days. Return to work on Sunday without restrictions.  Consider physical therapy for your neck but I doubt you'll need this with how well you're doing. Continue the muscle relaxant at nighttime as needed.  Use ankle brace until I see you back. Follow up with me in 4 weeks.

## 2017-05-12 NOTE — Assessment & Plan Note (Signed)
independently reviewed repeat radiographs and no evidence fracture.  Clinically better without tenderness at scaphoid.  Discontinue brace.  Start motion exercises.

## 2017-05-12 NOTE — Assessment & Plan Note (Signed)
2/2 strain and sprain.  MRI reassuring with a couple small disc bulges but no significant stenosis.  Clinically improving.  Can consider physical therapy if needed in the future.  Tylenol, aleve if needed.  Muscle relaxant as needed at nighttime.  F/u in 4 weeks.
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
## 2017-06-07 ENCOUNTER — Encounter: Payer: Self-pay | Admitting: Family Medicine

## 2017-06-07 ENCOUNTER — Ambulatory Visit (INDEPENDENT_AMBULATORY_CARE_PROVIDER_SITE_OTHER): Payer: Self-pay | Admitting: Family Medicine

## 2017-06-07 DIAGNOSIS — S199XXD Unspecified injury of neck, subsequent encounter: Secondary | ICD-10-CM

## 2017-06-07 DIAGNOSIS — S6991XD Unspecified injury of right wrist, hand and finger(s), subsequent encounter: Secondary | ICD-10-CM

## 2017-06-07 DIAGNOSIS — M25571 Pain in right ankle and joints of right foot: Secondary | ICD-10-CM
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
## 2017-06-08 NOTE — Assessment & Plan Note (Signed)
2/2 sprain.  Clinically improved.

## 2017-06-08 NOTE — Assessment & Plan Note (Signed)
2/2 strain and sprain.  MRI reassuring with a couple small disc bulges but no significant stenosis.  Improved clinically.  Tylenol, aleve if needed.  F/u prn.

## 2017-06-08 NOTE — Assessment & Plan Note (Signed)
clinically healed at this point.  Given home exercise program to do daily.  Can discontinue use of brace.  Follow up as needed.  Tylenol or aleve if needed.

## 2019-11-11 ENCOUNTER — Emergency Department: Payer: 59

## 2019-11-11 ENCOUNTER — Emergency Department
Admission: EM | Admit: 2019-11-11 | Discharge: 2019-11-11 | Disposition: A | Discharge status: Home / Self Care | Payer: 59 | Attending: Emergency Medicine | Admitting: Emergency Medicine

## 2019-11-11 ENCOUNTER — Other Ambulatory Visit: Payer: Self-pay

## 2019-11-11 ENCOUNTER — Encounter: Payer: Self-pay | Admitting: Emergency Medicine

## 2019-11-11 DIAGNOSIS — Z79899 Other long term (current) drug therapy: Secondary | ICD-10-CM | POA: Insufficient documentation

## 2019-11-11 DIAGNOSIS — Y999 Unspecified external cause status: Secondary | ICD-10-CM | POA: Diagnosis not present

## 2019-11-11 DIAGNOSIS — W540XXA Bitten by dog, initial encounter: Secondary | ICD-10-CM | POA: Diagnosis not present

## 2019-11-11 DIAGNOSIS — S41111A Laceration without foreign body of right upper arm, initial encounter: Secondary | ICD-10-CM | POA: Diagnosis not present

## 2019-11-11 DIAGNOSIS — Y939 Activity, unspecified: Secondary | ICD-10-CM | POA: Diagnosis not present

## 2019-11-11 DIAGNOSIS — Y92019 Unspecified place in single-family (private) house as the place of occurrence of the external cause: Secondary | ICD-10-CM | POA: Insufficient documentation

## 2019-11-11 MED ORDER — ONDANSETRON 4 MG PO TBDP
8.0000 mg | ORAL_TABLET | Freq: Once | ORAL | Status: AC
  Administered 2019-11-11: 4 mg via ORAL
  Filled 2019-11-11: qty 2

## 2019-11-11 MED ORDER — NAPROXEN 500 MG PO TABS: 500.0000 mg | ORAL_TABLET | Freq: Two times a day (BID) | ORAL | 0 refills | Status: AC

## 2019-11-11 MED ORDER — MORPHINE SULFATE (PF) 4 MG/ML IV SOLN
4.0000 mg | Freq: Once | INTRAVENOUS | Status: AC
  Administered 2019-11-11: 07:00:00 4 mg via INTRAMUSCULAR
  Filled 2019-11-11: qty 1

## 2019-11-11 MED ORDER — AMOXICILLIN-POT CLAVULANATE 875-125 MG PO TABS: 1.0000 | ORAL_TABLET | Freq: Two times a day (BID) | ORAL | 0 refills | Status: AC

## 2019-11-11 MED ORDER — LIDOCAINE-EPINEPHRINE 2 %-1:100000 IJ SOLN
20.0000 mL | Freq: Once | INTRAMUSCULAR | Status: AC
  Administered 2019-11-11: 20 mL
  Filled 2019-11-11: qty 1

## 2019-11-11 NOTE — ED Provider Notes (Signed)
[facility] Emergency Department Provider Note  ____________________________________________  Time seen: Approximately 6:43 AM  I have reviewed the triage vital signs and the nursing notes.   HISTORY  Chief Complaint Animal Bite    HPI Abigail Cantrell is a 24 y.o. female with a history of OCD bipolar PTSD who comes the ED after multiple dog bites.  She has a [place] Shepherd/collie mix breed dog who she reports has been acting somewhat volatile recently, attacked her boyfriend tonight.  She intervened to try to pull the dog off of her boyfriend, and the dog instead bit and scratched her on all 4 extremities including clamping down on her right forearm.  She complains of pain in the right forearm and wrist which limits her movement of the hand and wrist.  No numbness or paresthesia.  She also has some right leg pain afterward which is improved now.  She was able to bear weight immediately afterward.  Denies any head injury or facial trauma.  Last tetanus shot was 2018.  Pain is constant, severe intensity,  nonradiating, worse with movement, no alleviating factors  She reports that she acquired the dog in November 2020.  It was reported to have received all immunizations and routine care before she adopted it.  She has already contacted animal control who has impounded the dog and plans to observe it for 10 days.   Past Medical History:  Diagnosis Date  . Bipolar disorder (HCC)   . Depression   . OCD (obsessive compulsive disorder)   . OCD (obsessive compulsive disorder)   . PTSD (post-traumatic stress disorder)   . Vision abnormalities      Patient Active Problem List   Diagnosis Date Noted  . Neck injury, subsequent encounter 05/05/2017  . Right wrist injury, subsequent encounter 05/05/2017  . Rib contusion, right, initial encounter 05/05/2017  . Right ankle pain 05/05/2017  . Adjustment disorder with depressed mood 05/19/2016  . Insomnia 11/30/2012  .  OCD (obsessive compulsive disorder) 08/22/2012     Past Surgical History:  Procedure Laterality Date  . estraction of wisdom teeth       Prior to Admission medications   Medication Sig Start Date End Date Taking? Authorizing Provider  amoxicillin-clavulanate (AUGMENTIN) 875-125 MG tablet Take 1 tablet by mouth 2 (two) times daily. 11/11/19   Sharman Cheek, MD  buPROPion (WELLBUTRIN) 75 MG tablet Take 75 mg by mouth 2 (two) times daily.    [provider]  citalopram (CELEXA) 20 MG tablet Take 1 tablet (20 mg total) by mouth daily. Patient not taking: Reported on 07/08/2016 12/28/12   Tonye Pearson, MD  diphenoxylate-atropine (LOMOTIL) 2.5-0.025 MG tablet Take 1 tablet by mouth 4 (four) times daily as needed for diarrhea or loose stools. 07/08/16   Elson Areas, PA-C  JUNEL 1/20 1-20 MG-MCG tablet TAKE 1 TABLET(S) EVERY DAY BY ORAL ROUTE. 03/11/17   [provider]  lamoTRIgine (LAMICTAL) 150 MG tablet Take 150 mg by mouth at bedtime. 04/15/16   [provider]  methocarbamol (ROBAXIN) 500 MG tablet Take 1 tablet (500 mg total) by mouth every 8 (eight) hours as needed. 05/03/17   Hudnall, Azucena Fallen, MD  naproxen (NAPROSYN) 500 MG tablet Take 1 tablet (500 mg total) by mouth 2 (two) times daily. 04/28/17   Vanetta Mulders, MD  naproxen (NAPROSYN) 500 MG tablet Take 1 tablet (500 mg total) by mouth 2 (two) times daily with a meal. 11/11/19   Sharman Cheek, MD  QUEtiapine (SEROQUEL) 100 MG tablet ONE TABLET BY MOUTH AT BEDTIME Patient not taking: Reported on 07/08/2016 04/11/13   Tonye Pearson, MD  triamcinolone cream (KENALOG) 0.1 % Apply topically 2 (two) times daily. NEED VISIT! Patient not taking: Reported on 07/08/2016 04/24/13   Godfrey Pick, PA-C  ziprasidone (GEODON) 80 MG capsule Take 80 mg by mouth every evening. 05/07/16   [provider]     Allergies Patient has no known allergies.   No family history on file.  Social  History Social History   Tobacco Use  . Smoking status: Never Smoker  . Smokeless tobacco: Never Used  Substance Use Topics  . Alcohol use: No  . Drug use: No    Review of Systems  Constitutional:   No fever or chills.  ENT:   No sore throat. No rhinorrhea. Cardiovascular:   No chest pain or syncope. Respiratory:   No dyspnea or cough. Gastrointestinal:   Negative for abdominal pain, vomiting and diarrhea.  Musculoskeletal: Right forearm pain and right leg pain as above All other systems reviewed and are negative except as documented above in ROS and HPI.  ____________________________________________   PHYSICAL EXAM:  VITAL SIGNS: ED Triage Vitals  Enc Vitals Group     BP 11/11/19 0209 (!) 141/104     Pulse Rate 11/11/19 0209 (!) 102     Resp 11/11/19 0209 18     Temp 11/11/19 0209 97.7 F (36.5 C)     Temp Source 11/11/19 0209 Oral     SpO2 11/11/19 0209 99 %     Weight 11/11/19 0216 200 lb (90.7 kg)     Height 11/11/19 0216 5\' 6"  (1.676 m)     Head Circumference --      Peak Flow --      Pain Score 11/11/19 0215 6     Pain Loc --      Pain Edu? --      Excl. in GC? --     Vital signs reviewed, nursing assessments reviewed.   Constitutional:   Alert and oriented. Non-toxic appearance.  Very anxious Eyes:   Conjunctivae are normal. EOMI. PERRL. ENT      Head:   Normocephalic and atraumatic.      Nose:   Wearing a mask.      Mouth/Throat:   Wearing a mask.      Neck:   No meningismus. Full ROM. Hematological/Lymphatic/Immunilogical:   No cervical lymphadenopathy. Cardiovascular:   RRR. Symmetric bilateral radial and DP pulses.  No murmurs. Cap refill less than 2 seconds. Respiratory:   Normal respiratory effort without tachypnea/retractions.  Gastrointestinal:   Soft and nontender. Non distended. There is no CVA tenderness.  No rebound, rigidity, or guarding. Musculoskeletal:   Normal range of motion in all extremities. No joint effusions.  There is swelling  and tenderness of the right forearm diffusely with two 1 cm lacerations, one on the volar forearm, 1 near the radial styloid.  Both hemostatic.  Not gaping or deep.  Innumerable abrasions over bilateral lower legs and bilateral forearms.  Compartments are soft. Neurologic:   Normal speech and language.  Motor grossly intact. No acute focal neurologic deficits are appreciated.  Skin:    Skin is warm, dry with multiple small wounds as above.. No rash noted.  No petechiae, purpura, or bullae.  ____________________________________________    LABS (pertinent positives/negatives) (all labs ordered are listed, but only abnormal results are displayed) Labs Reviewed - No data to display  ____________________________________________   EKG    ____________________________________________    RADIOLOGY  DG Forearm Right  Result Date: 11/11/2019 CLINICAL DATA:  Multiple dog bites to the right upper extremity. EXAM: RIGHT FOREARM - 2 VIEW COMPARISON:  None. FINDINGS: No fracture or bone lesion. Wrist and elbow joints are normally spaced and aligned. There is soft tissue swelling with a small amount of soft tissue air along the mid to distal forearm. No radiopaque foreign body. IMPRESSION: 1. No fracture or dislocation. 2. Soft tissue injury consistent with the reported dog bite to the mid to distal forearm. No radiopaque foreign body. Electronically Signed   By: Lajean Manes M.D.   On: 11/11/2019 07:03   DG Tibia/Fibula Right  Result Date: 11/11/2019 CLINICAL DATA:  Multiple dog bites including the right leg. EXAM: RIGHT TIBIA AND FIBULA - 2 VIEW COMPARISON:  None. FINDINGS: No fracture or bone lesion. Knee and ankle joints are normally spaced and aligned. Soft tissues are unremarkable. IMPRESSION: Negative. Electronically Signed   By: Lajean Manes M.D.   On: 11/11/2019 07:05   DG Hand Complete Right  Result Date: 11/11/2019 CLINICAL DATA:  Multiple dog bites this morning to the right extremity.  EXAM: RIGHT HAND - COMPLETE 3+ VIEW COMPARISON:  None. FINDINGS: No fracture or bone lesion. Joints are normally spaced and aligned. There is soft tissue swelling with small foci of subcutaneous air along the dorsal distal forearm. Hand soft tissues are unremarkable. IMPRESSION: No fracture, joint abnormality or radiopaque foreign body. Electronically Signed   By: Lajean Manes M.D.   On: 11/11/2019 07:04    ____________________________________________   PROCEDURES .Marland KitchenLaceration Repair  Date/Time: 11/11/2019 7:22 AM Performed by: Carrie Mew, MD Authorized by: Carrie Mew, MD   Consent:    Consent obtained:  Verbal   Consent given by:  Patient   Risks discussed:  Infection, pain, retained foreign body, poor cosmetic result and poor wound healing Anesthesia (see MAR for exact dosages):    Anesthesia method:  Local infiltration   Local anesthetic:  Lidocaine 2% WITH epi Laceration details:    Location:  Shoulder/arm   Shoulder/arm location:  R lower arm   Length (cm):  1 Repair type:    Repair type:  Simple Pre-procedure details:    Preparation:  Patient was prepped and draped in usual sterile fashion and imaging obtained to evaluate for foreign bodies Exploration:    Hemostasis achieved with:  Direct pressure   Wound exploration: wound explored through full range of motion and entire depth of wound probed and visualized     Wound extent: no foreign bodies/material noted, no muscle damage noted, no nerve damage noted, no tendon damage noted, no underlying fracture noted and no vascular damage noted     Contaminated: no   Treatment:    Area cleansed with:  Saline and Betadine   Amount of cleaning:  Extensive   Irrigation solution:  Sterile saline   Irrigation method:  Pressure wash   Visualized foreign bodies/material removed: no   Skin repair:    Repair method:  Sutures   Suture size:  4-0   Wound skin closure material used: monocryl.   Suture technique:  Simple  interrupted   Number of sutures:  1 Approximation:    Approximation:  Close Post-procedure details:    Dressing:  Sterile dressing   Patient tolerance of procedure:  Tolerated well, no immediate complications Comments:        Marland KitchenMarland KitchenLaceration Repair  Date/Time: 11/11/2019 7:22 AM Performed by:  Sharman Cheek, MD Authorized by: Sharman Cheek, MD   Consent:    Consent obtained:  Verbal   Consent given by:  Patient   Risks discussed:  Infection, pain, retained foreign body, poor cosmetic result and poor wound healing Anesthesia (see MAR for exact dosages):    Anesthesia method:  Local infiltration   Local anesthetic:  Lidocaine 2% WITH epi Laceration details:    Location:  Shoulder/arm   Shoulder/arm location:  R lower arm   Length (cm):  1.5 Repair type:    Repair type:  Simple Pre-procedure details:    Preparation:  Patient was prepped and draped in usual sterile fashion and imaging obtained to evaluate for foreign bodies Exploration:    Hemostasis achieved with:  Direct pressure   Wound exploration: wound explored through full range of motion and entire depth of wound probed and visualized     Wound extent: no foreign bodies/material noted, no muscle damage noted, no nerve damage noted, no tendon damage noted, no underlying fracture noted and no vascular damage noted     Contaminated: no   Treatment:    Area cleansed with:  Saline and Betadine   Amount of cleaning:  Extensive   Irrigation solution:  Sterile saline   Irrigation method:  Pressure wash   Visualized foreign bodies/material removed: no   Skin repair:    Repair method:  Sutures   Suture size:  2-0   Wound skin closure material used: monocryl.   Suture technique:  Simple interrupted   Number of sutures:  1 Approximation:    Approximation:  Close Post-procedure details:    Dressing:  Sterile dressing   Patient tolerance of procedure:  Tolerated well, no immediate  complications    ____________________________________________    CLINICAL IMPRESSION / ASSESSMENT AND PLAN / ED COURSE  Medications ordered in the ED: Medications  lidocaine-EPINEPHrine (XYLOCAINE W/EPI) 2 %-1:100000 (with pres) injection 20 mL (20 mLs Infiltration Given 11/11/19 5102)  morphine 4 MG/ML injection 4 mg (4 mg Intramuscular Given 11/11/19 0633)  ondansetron (ZOFRAN-ODT) disintegrating tablet 8 mg (4 mg Oral Given 11/11/19 5852)    Pertinent labs & imaging results that were available during my care of the patient were reviewed by me and considered in my medical decision making (see chart for details).  Abigail Cantrell was evaluated in Emergency Department on 11/11/2019 for the symptoms described in the history of present illness. She was evaluated in the context of the global COVID-19 pandemic, which necessitated consideration that the patient might be at risk for infection with the SARS-CoV-2 virus that causes COVID-19. Institutional protocols and algorithms that pertain to the evaluation of patients at risk for COVID-19 are in a state of rapid change based on information released by regulatory bodies including the CDC and federal and state organizations. These policies and algorithms were followed during the patient's care in the ED.   Patient presents with multiple superficial wounds after dog bite.  Tetanus is already up-to-date.  Will obtain x-rays of right forearm hand and right tibia, proceed with wound care.  ----------------------------------------- 7:24 AM on 11/11/2019 -----------------------------------------  Wound care complete.  X-ray is unremarkable.  No evidence of compartment syndrome, vascular injury, tendon injury.  Start Augmentin, close follow-up.  Return precautions discussed.      ____________________________________________   FINAL CLINICAL IMPRESSION(S) / ED DIAGNOSES    Final diagnoses:  Dog bite, initial encounter  Laceration of right upper  extremity, initial encounter     ED Discharge Orders  Ordered    amoxicillin-clavulanate (AUGMENTIN) 875-125 MG tablet  2 times daily     11/11/19 0643    naproxen (NAPROSYN) 500 MG tablet  2 times daily with meals     11/11/19 0643          Portions of this note were generated with dragon dictation software. Dictation errors may occur despite best attempts at proofreading.   Sharman CheekStafford, Emmette Katt, MD 11/11/19 [phone]

## 2019-11-11 NOTE — ED Triage Notes (Signed)
Patient states that her dog attacked her. Patient with multiple bites to bilateral lower arm and lower legs. Patient states that dog latched on to her right wrist. Patient states that she is unable to move her left wrist at this time. Patient states that the dog's vaccinations are up to date. Patient states that animal control was already notified.

## 2021-12-01 IMAGING — DX DG TIBIA/FIBULA 2V*R*
4 series · 4 of 4 positions shown · non-contrast
Comparison: None.

CLINICAL DATA: Multiple dog bites including the right leg.

EXAM:
RIGHT TIBIA AND FIBULA - 2 VIEW

[tibia ap (1 of 2)]
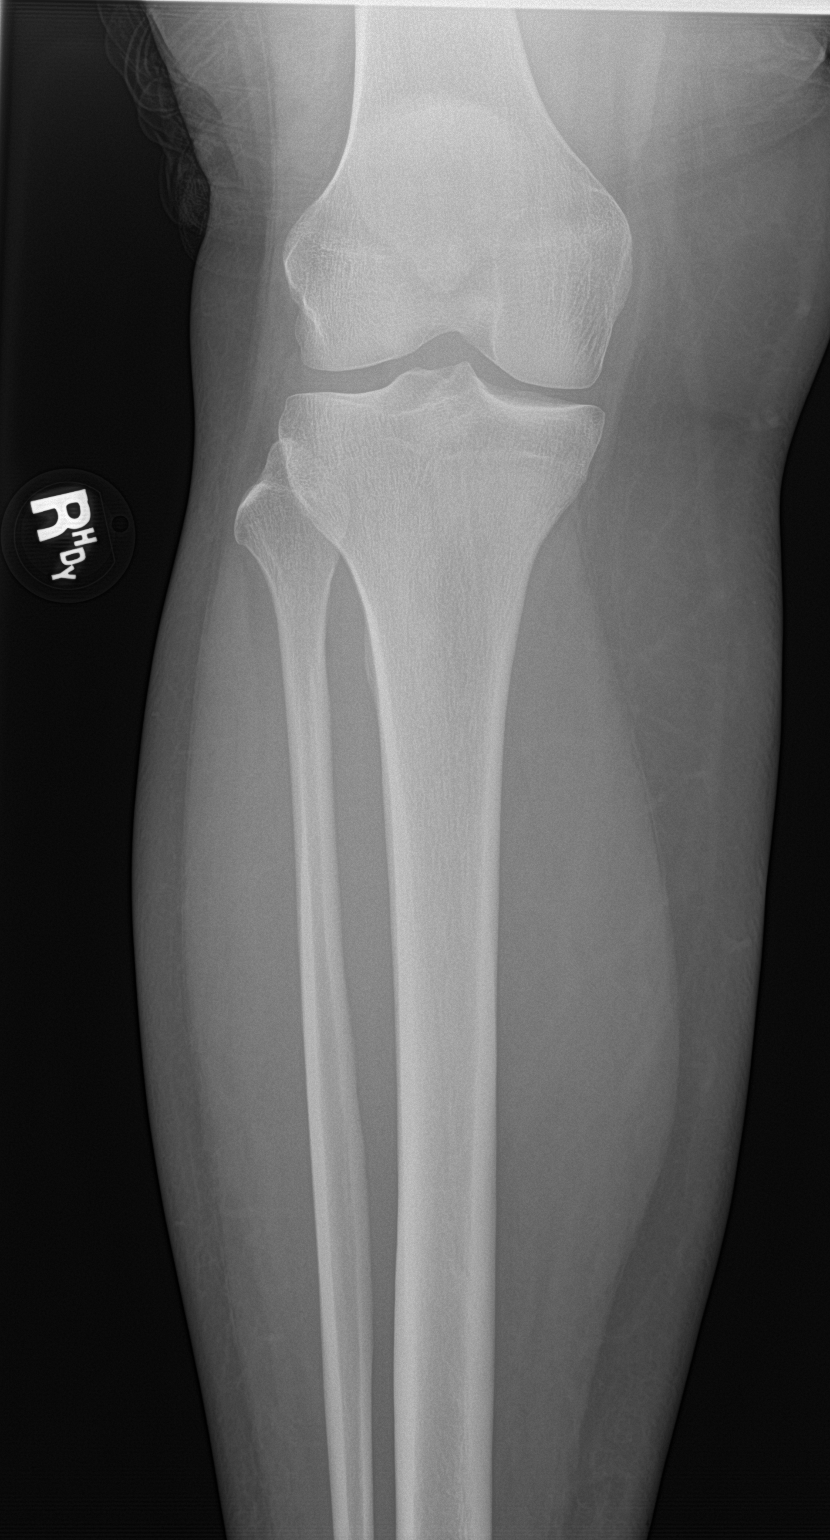

[tibia ap (2 of 2)]
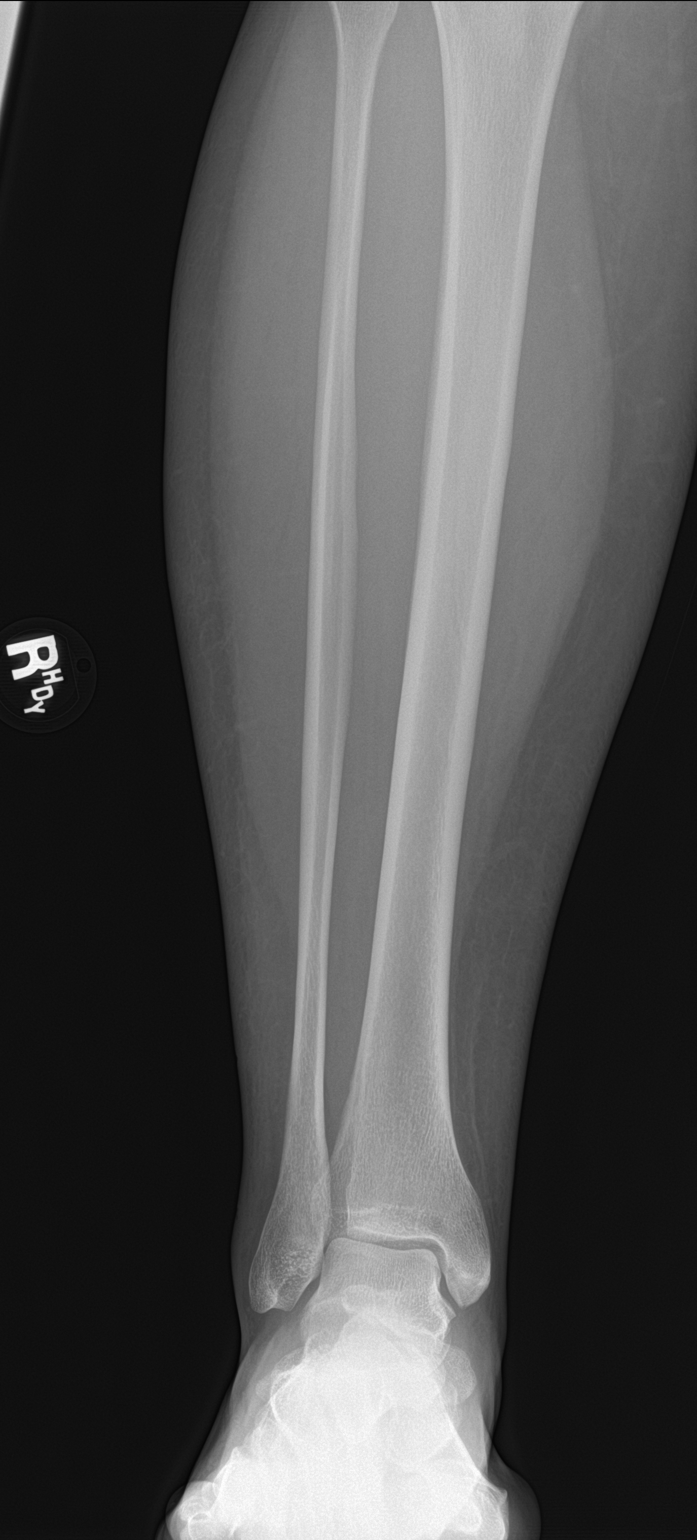

[tibia lat (1 of 2)]
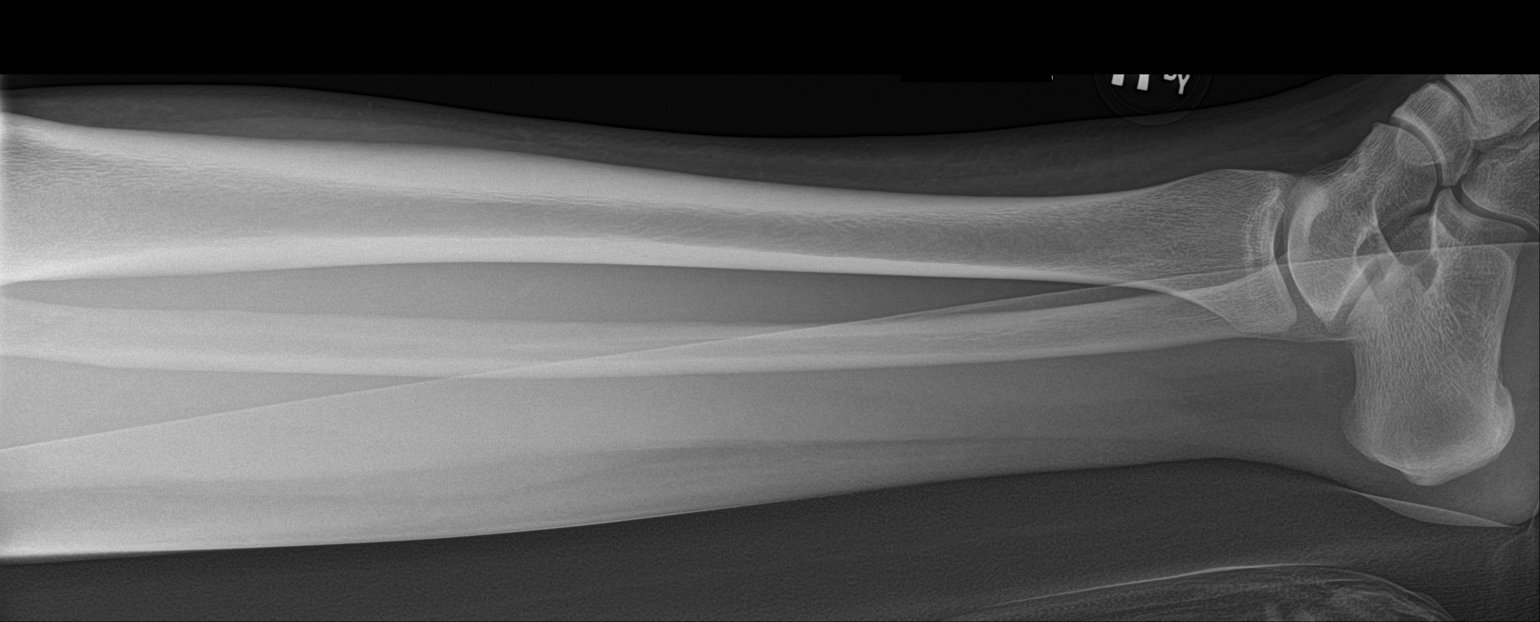

[tibia lat (2 of 2)]
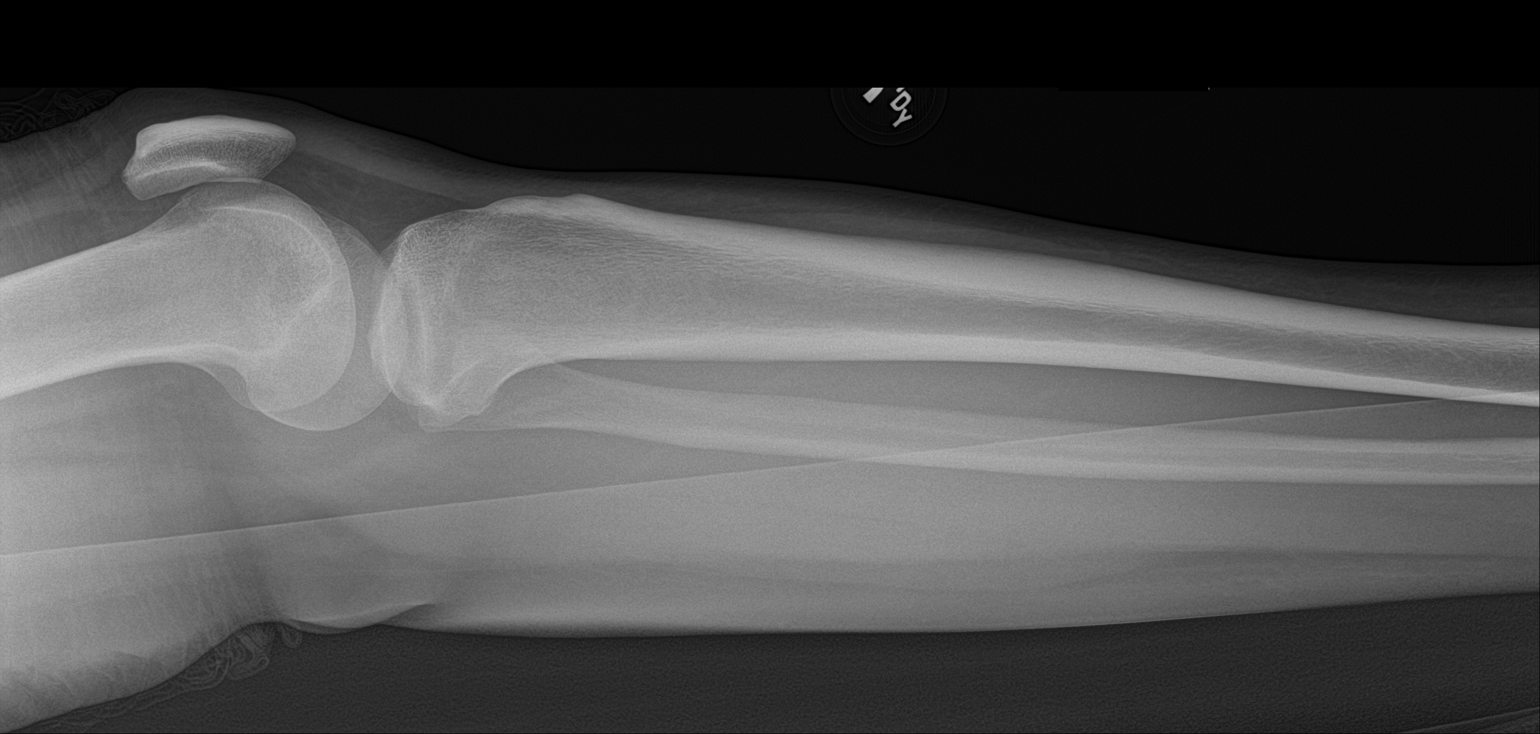

[4 of 4 positions shown; findings below may reference images not displayed]

FINDINGS: No fracture or bone lesion.

Knee and ankle joints are normally spaced and aligned.

Soft tissues are unremarkable.
IMPRESSION: Negative.

## 2021-12-01 IMAGING — DX DG HAND COMPLETE 3+V*R*
3 series · 3 of 3 positions shown · non-contrast
Comparison: None.

CLINICAL DATA: Multiple dog bites this morning to the right
extremity.

EXAM:
RIGHT HAND - COMPLETE 3+ VIEW

[hand ap]
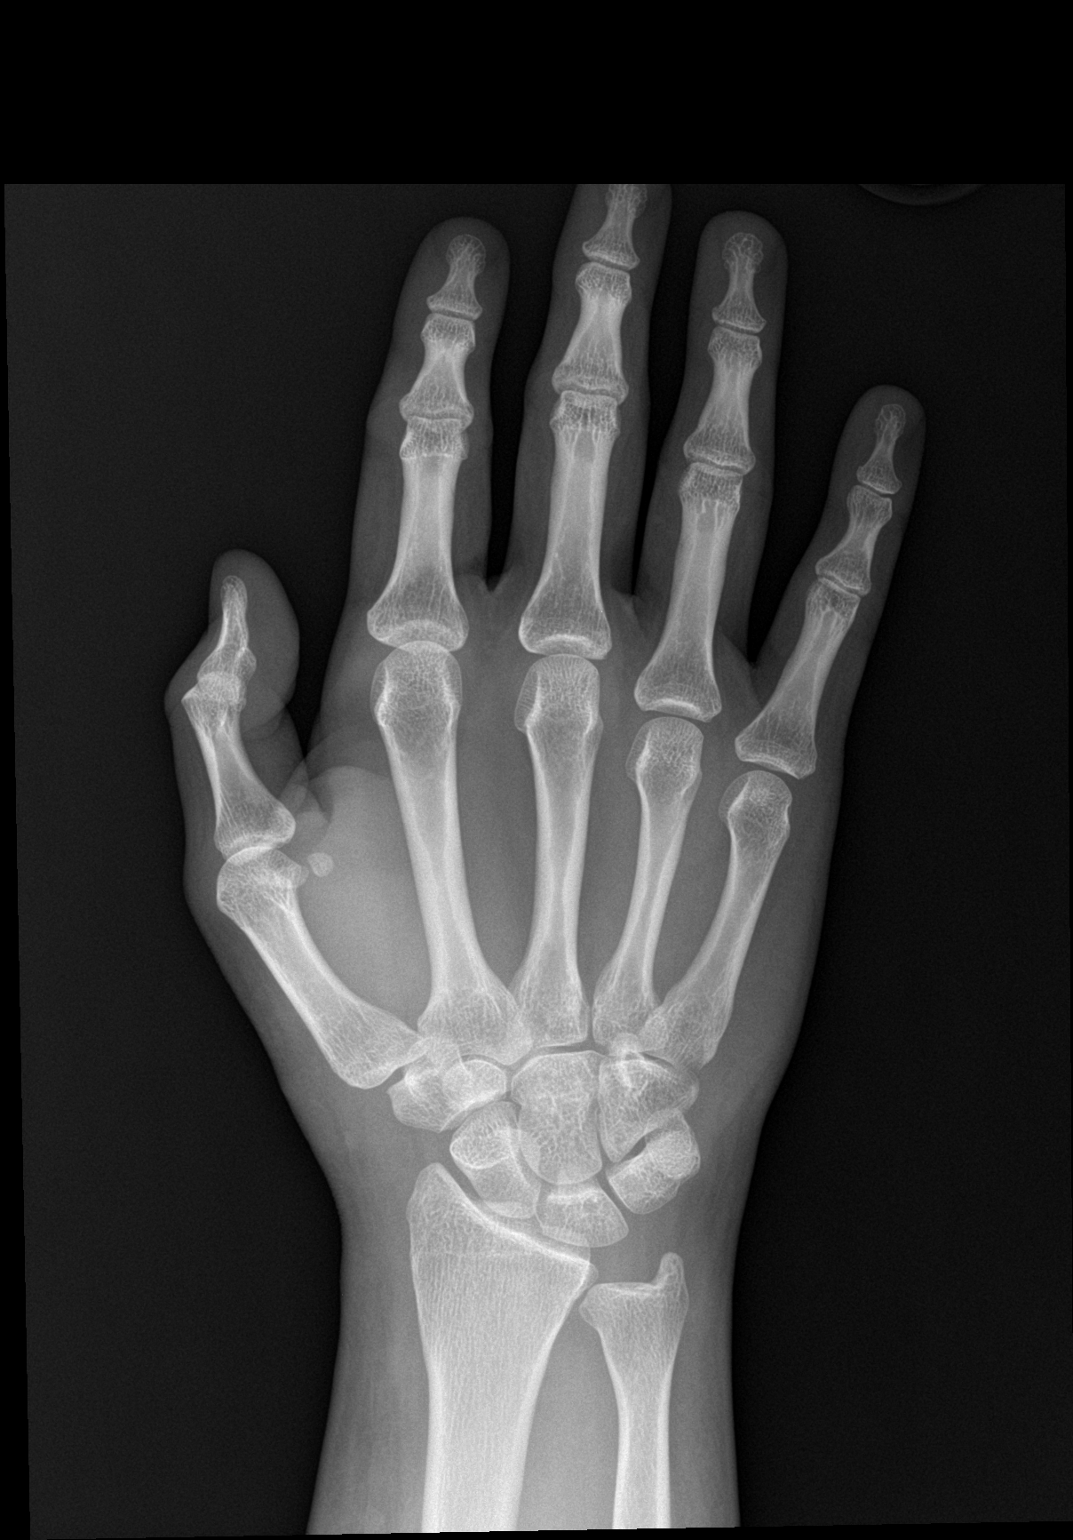

[hand obl]
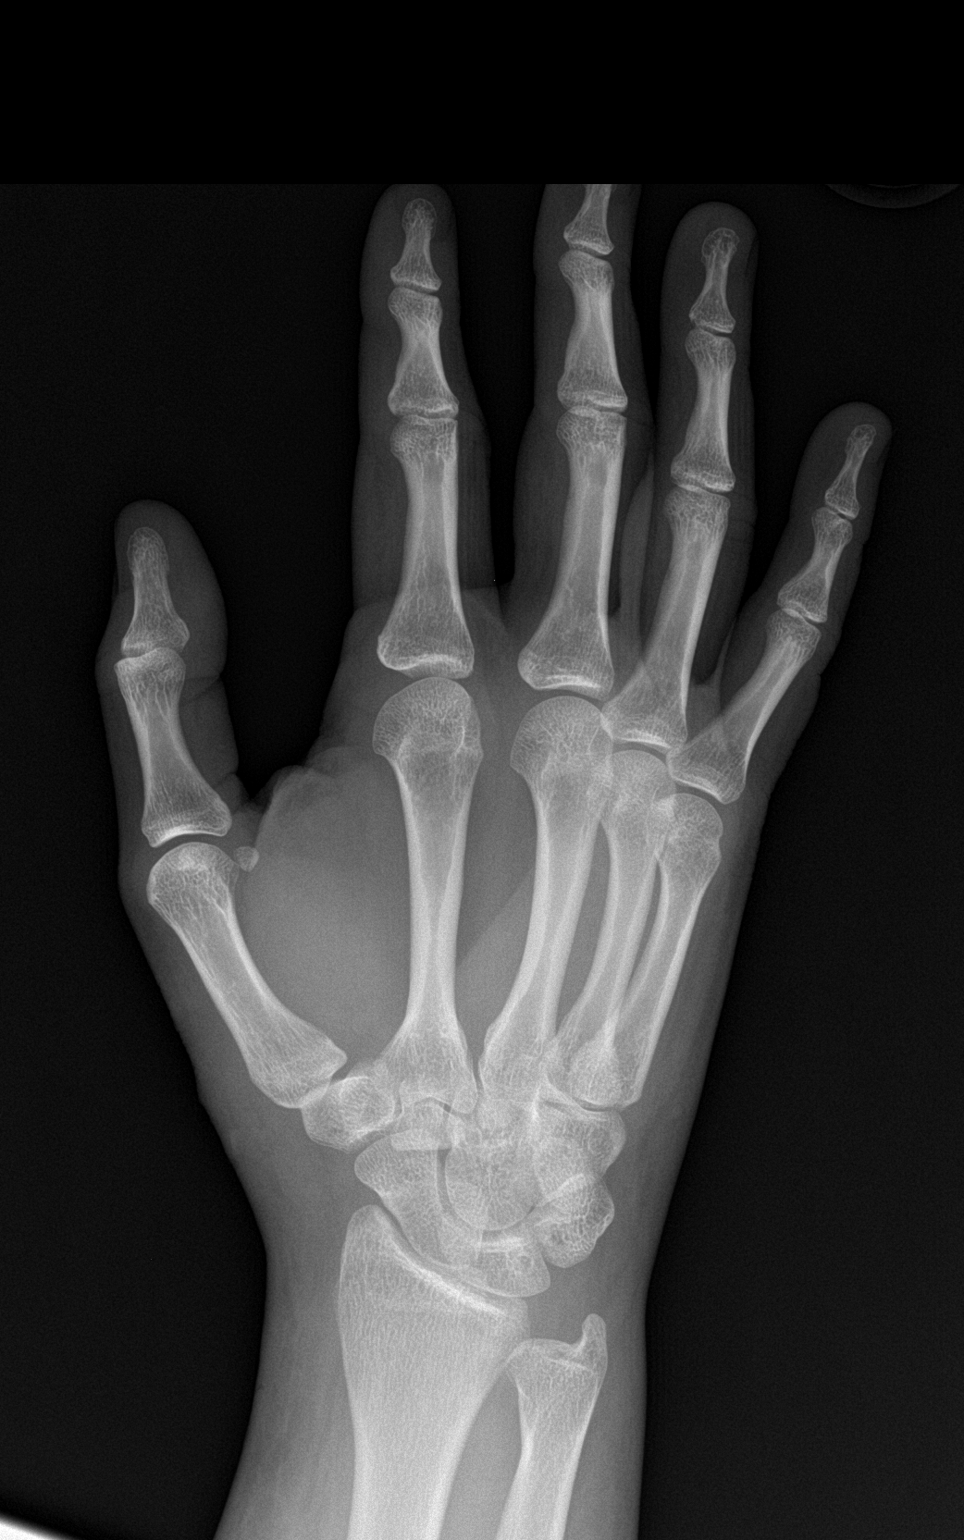

[hand lat]
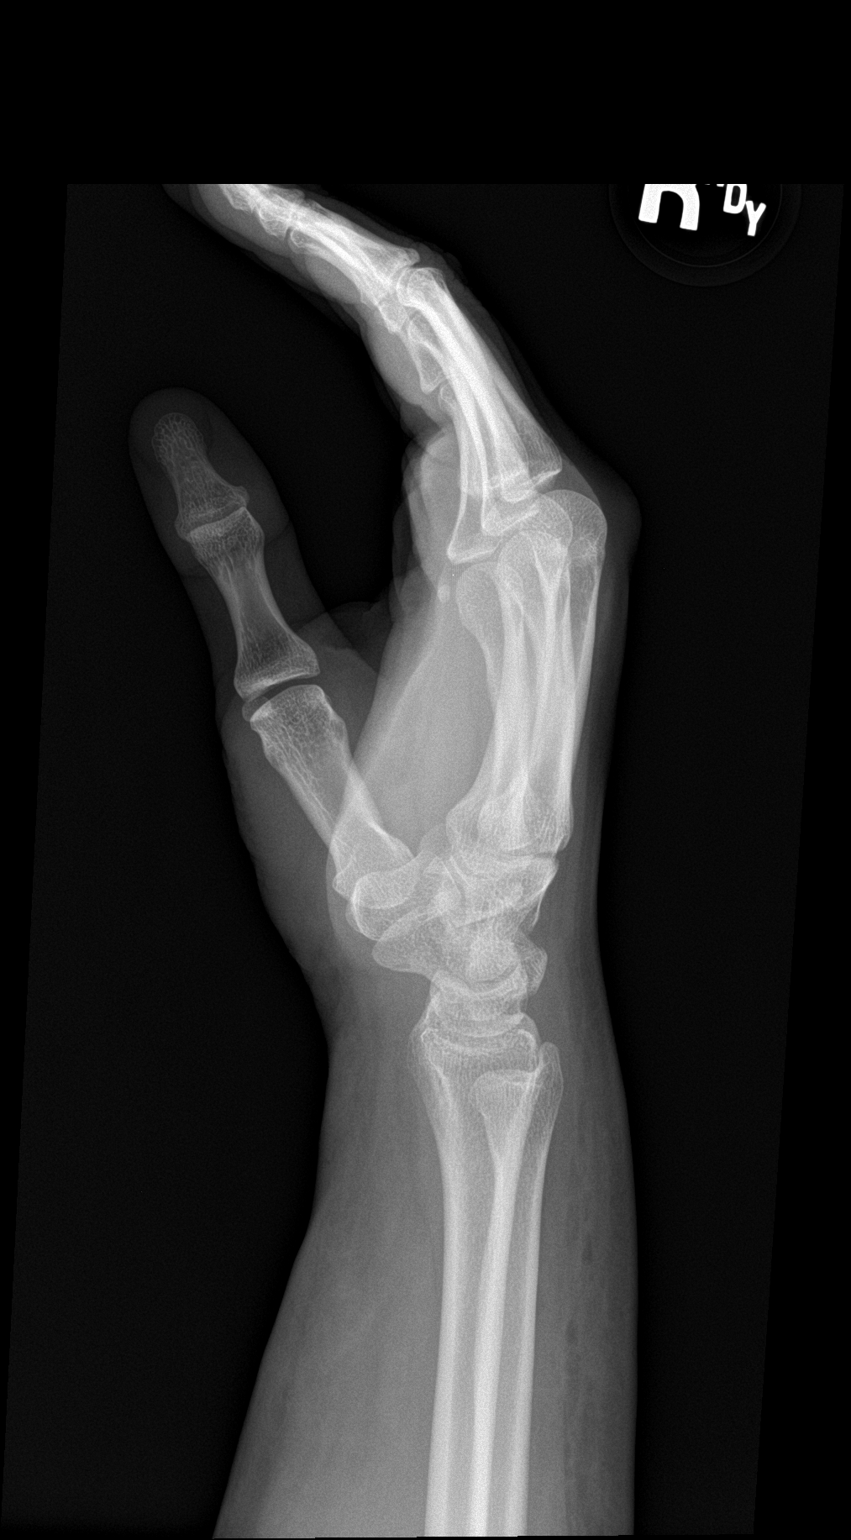

[3 of 3 positions shown; findings below may reference images not displayed]

FINDINGS: No fracture or bone lesion.

Joints are normally spaced and aligned.

There is soft tissue swelling with small foci of subcutaneous air
along the dorsal distal forearm. Hand soft tissues are unremarkable.
IMPRESSION: No fracture, joint abnormality or radiopaque foreign body.

## 2022-03-26 ENCOUNTER — Emergency Department: Payer: No Typology Code available for payment source

## 2022-03-26 ENCOUNTER — Other Ambulatory Visit: Payer: Self-pay

## 2022-03-26 DIAGNOSIS — W208XXA Other cause of strike by thrown, projected or falling object, initial encounter: Secondary | ICD-10-CM | POA: Diagnosis not present

## 2022-03-26 DIAGNOSIS — S52591A Other fractures of lower end of right radius, initial encounter for closed fracture: Secondary | ICD-10-CM | POA: Diagnosis not present

## 2022-03-26 DIAGNOSIS — Y990 Civilian activity done for income or pay: Secondary | ICD-10-CM | POA: Diagnosis not present

## 2022-03-26 DIAGNOSIS — S6991XA Unspecified injury of right wrist, hand and finger(s), initial encounter: Secondary | ICD-10-CM | POA: Diagnosis present

## 2022-03-26 NOTE — ED Triage Notes (Addendum)
Pt states was at work when two palates of computer monitors fell on her right arm. Pt states she has most pain in right wrist, but complains of head injury and right upper extremity pain from shoulder to fingers. Pt also complains of neck pain, no profile listed for [employer] pt's employer in profile for [employer].

## 2022-03-26 NOTE — ED Triage Notes (Signed)
FIRST NURSE NOTE:  Pt arrived via [facility] from work [employer], pt reports she had 2 pallets of monitors fall on top of her, denies any LOC per EMS.  Pt has brusing and decreased ROM to R wrist pulses intact per EMS.  130/80 P-68 98% RA

## 2022-03-27 ENCOUNTER — Emergency Department
Admission: EM | Admit: 2022-03-27 | Discharge: 2022-03-27 | Disposition: A | Discharge status: Home / Self Care | Payer: No Typology Code available for payment source | Attending: Emergency Medicine | Admitting: Emergency Medicine

## 2022-03-27 DIAGNOSIS — S62101A Fracture of unspecified carpal bone, right wrist, initial encounter for closed fracture: Secondary | ICD-10-CM

## 2022-03-27 MED ORDER — IBUPROFEN 800 MG PO TABS
800.0000 mg | ORAL_TABLET | Freq: Once | ORAL | Status: AC
  Administered 2022-03-27: 800 mg via ORAL
  Filled 2022-03-27: qty 1

## 2022-03-27 MED ORDER — OXYCODONE-ACETAMINOPHEN 5-325 MG PO TABS
2.0000 | ORAL_TABLET | Freq: Once | ORAL | Status: AC
  Administered 2022-03-27: 2 via ORAL
  Filled 2022-03-27: qty 2

## 2022-03-27 MED ORDER — ONDANSETRON 4 MG PO TBDP: 4.0000 mg | ORAL_TABLET | Freq: Four times a day (QID) | ORAL | 0 refills | Status: AC | PRN

## 2022-03-27 MED ORDER — OXYCODONE-ACETAMINOPHEN 5-325 MG PO TABS: 2.0000 | ORAL_TABLET | Freq: Four times a day (QID) | ORAL | 0 refills | Status: AC | PRN

## 2022-03-27 MED ORDER — IBUPROFEN 800 MG PO TABS: 800.0000 mg | ORAL_TABLET | Freq: Three times a day (TID) | ORAL | 0 refills | Status: AC | PRN

## 2022-03-27 MED ORDER — ONDANSETRON 4 MG PO TBDP
4.0000 mg | ORAL_TABLET | Freq: Once | ORAL | Status: AC
  Administered 2022-03-27: 4 mg via ORAL
  Filled 2022-03-27: qty 1

## 2022-03-27 NOTE — Discharge Instructions (Addendum)
Xray shows Transverse mildly displaced distal radius fracture extending into the radiocarpal joint space.  You will need to follow-up closely with orthopedic surgery and keep your splint on and clean and dry at all times.  You may need surgery for this fracture.   You are being provided a prescription for opiates (also known as narcotics) for pain control.  Opiates can be addictive and should only be used when absolutely necessary for pain control when other alternatives do not work.  We recommend you only use them for the recommended amount of time and only as prescribed.  Please do not take with other sedative medications or alcohol.  Please do not drive, operate machinery, make important decisions while taking opiates.  Please note that these medications can be addictive and have high abuse potential.  Patients can become addicted to narcotics after only taking them for a few days.  Please keep these medications locked away from children, teenagers or any family members with history of substance abuse.  Narcotic pain medicine may also make you constipated.  You may use over-the-counter medications such as MiraLAX, Colace to prevent constipation.  If you become constipated, you may use over-the-counter enemas as needed.  Itching and nausea are also common side effects of narcotic pain medication.  If you develop uncontrolled vomiting or a rash, please stop these medications and seek medical care.

## 2022-03-27 NOTE — ED Provider Notes (Addendum)
[facility] Provider Note    Event Date/Time   First MD Initiated Contact with Patient 03/27/22 [phone]     (approximate)   History   Arm Injury   HPI  Abigail Cantrell is a 26 y.o. female who is left-hand dominant without significant past medical history who presents to the emergency department with a right wrist injury.  States that she was at work and a pallet full of monitors fell on top of her.  She walked to the palate from striking her head by holding her right hand above her face.  She is complaining of right wrist pain.  She did not lose consciousness.  She is not on blood thinners.  No chest or abdominal pain.  No neck or back pain.  No numbness or weakness.   History provided by patient and mother.    Past Medical History:  Diagnosis Date   Bipolar disorder (HCC)    Depression    OCD (obsessive compulsive disorder)    OCD (obsessive compulsive disorder)    PTSD (post-traumatic stress disorder)    Vision abnormalities     Past Surgical History:  Procedure Laterality Date   estraction of wisdom teeth      MEDICATIONS:  Prior to Admission medications   Medication Sig Start Date End Date Taking? Authorizing Provider  amoxicillin-clavulanate (AUGMENTIN) 875-125 MG tablet Take 1 tablet by mouth 2 (two) times daily. 11/11/19   Sharman Cheek, MD  buPROPion (WELLBUTRIN) 75 MG tablet Take 75 mg by mouth 2 (two) times daily.    [provider]  citalopram (CELEXA) 20 MG tablet Take 1 tablet (20 mg total) by mouth daily. Patient not taking: Reported on 07/08/2016 12/28/12   Tonye Pearson, MD  diphenoxylate-atropine (LOMOTIL) 2.5-0.025 MG tablet Take 1 tablet by mouth 4 (four) times daily as needed for diarrhea or loose stools. 07/08/16   Elson Areas, PA-C  JUNEL 1/20 1-20 MG-MCG tablet TAKE 1 TABLET(S) EVERY DAY BY ORAL ROUTE. 03/11/17   [provider]  lamoTRIgine (LAMICTAL) 150 MG tablet Take 150 mg by mouth at bedtime.  04/15/16   [provider]  methocarbamol (ROBAXIN) 500 MG tablet Take 1 tablet (500 mg total) by mouth every 8 (eight) hours as needed. 05/03/17   Hudnall, Azucena Fallen, MD  naproxen (NAPROSYN) 500 MG tablet Take 1 tablet (500 mg total) by mouth 2 (two) times daily. 04/28/17   Vanetta Mulders, MD  naproxen (NAPROSYN) 500 MG tablet Take 1 tablet (500 mg total) by mouth 2 (two) times daily with a meal. 11/11/19   Sharman Cheek, MD  QUEtiapine (SEROQUEL) 100 MG tablet ONE TABLET BY MOUTH AT BEDTIME Patient not taking: Reported on 07/08/2016 04/11/13   Tonye Pearson, MD  triamcinolone cream (KENALOG) 0.1 % Apply topically 2 (two) times daily. NEED VISIT! Patient not taking: Reported on 07/08/2016 04/24/13   Godfrey Pick, PA-C  ziprasidone (GEODON) 80 MG capsule Take 80 mg by mouth every evening. 05/07/16   [provider]    Physical Exam   Triage Vital Signs: ED Triage Vitals [03/26/22 2319]  Enc Vitals Group     BP (!) 139/91     Pulse Rate 67     Resp 16     Temp 98.2 F (36.8 C)     Temp Source Oral     SpO2 100 %     Weight 220 lb (99.8 kg)     Height 5\' 6"  (1.676 m)  Head Circumference      Peak Flow      Pain Score 8     Pain Loc      Pain Edu?      Excl. in GC?     Most recent vital signs: Vitals:   03/27/22 0430 03/27/22 0518  BP: (!) 136/95 130/86  Pulse: (!) 55 (!) 57  Resp: 17 17  Temp:    SpO2: 100% 99%     CONSTITUTIONAL: Alert and oriented and responds appropriately to questions. Well-appearing; well-nourished; GCS 15 HEAD: Normocephalic; atraumatic EYES: Conjunctivae clear, PERRL, EOMI ENT: normal nose; no rhinorrhea; moist mucous membranes; pharynx without lesions noted; no dental injury; no septal hematoma, no epistaxis; no facial deformity or bony tenderness NECK: Supple, no midline spinal tenderness, step-off or deformity; trachea midline CARD: RRR; S1 and S2 appreciated; no murmurs, no clicks, no rubs, no gallops RESP: Normal  chest excursion without splinting or tachypnea; breath sounds clear and equal bilaterally; no wheezes, no rhonchi, no rales; no hypoxia or respiratory distress CHEST:  chest wall stable, no crepitus or ecchymosis or deformity, nontender to palpation; no flail chest ABD/GI: Normal bowel sounds; non-distended; soft, non-tender, no rebound, no guarding; no ecchymosis or other lesions noted PELVIS:  stable, nontender to palpation BACK:  The back appears normal; no midline spinal tenderness, step-off or deformity EXT: Tender over the dorsal right wrist with no significant deformity but there is soft tissue swelling.  She has a 2+ left and right radial pulse.  Normal capillary refill.  She keeps her fingers completely flex but is able to extend them slowly.  No tenderness over the elbow, proximal forearm, humerus, shoulder.  Compartments are soft.  Normal sensation in the right upper extremity. SKIN: Normal color for age and race; warm NEURO: No facial asymmetry, normal speech, moving all extremities equally  ED Results / Procedures / Treatments   LABS: (all labs ordered are listed, but only abnormal results are displayed) Labs Reviewed - No data to display   EKG:    RADIOLOGY: My personal review and interpretation of imaging: X-ray of the humerus shows no acute abnormality.  X-ray of the forearm shows a mildly displaced distal radius fracture. CT head and cervical spine show no acute traumatic injury    I have personally reviewed all radiology reports. DG Humerus Right  Result Date: 03/27/2022 CLINICAL DATA:  Injury, pallets fell on patient at work. Right arm pain. EXAM: RIGHT HUMERUS - 2+ VIEW COMPARISON:  None Available. FINDINGS: Cortical margins of the humerus are intact. There is no evidence of fracture or other focal bone lesions. Soft tissues are unremarkable. IMPRESSION: Negative radiographs of the right humerus. Electronically Signed   By: Narda Rutherford M.D.   On: 03/27/2022 00:05    DG Hand Complete Right  Result Date: 03/27/2022 CLINICAL DATA:  Injury, pallets fell on patient at work. Right arm pain. EXAM: RIGHT HAND - COMPLETE 3+ VIEW COMPARISON:  None Available. FINDINGS: Transverse mildly displaced distal radius fracture involving the radial styloid. Fracture extends into the radiocarpal joint space. No additional fracture of the hand. Digit assessment is partially obscured due to positioning and osseous overlap. Soft tissue edema is noted at the fracture site. IMPRESSION: Transverse mildly displaced distal radius fracture extending into the radiocarpal joint space. No additional fracture of the hand. Electronically Signed   By: Narda Rutherford M.D.   On: 03/27/2022 00:05   DG Forearm Right  Result Date: 03/27/2022 CLINICAL DATA:  Injury, pallets fell on  patient at work. Right arm pain. EXAM: RIGHT FOREARM - 2 VIEW COMPARISON:  None Available. FINDINGS: There is a transverse minimally displaced distal radius fracture extending into the radiocarpal joint. No additional fracture of the forearm. Proximal radius is intact. No ulnar fracture. Soft tissue edema is noted about the wrist. IMPRESSION: Transverse minimally displaced distal radius fracture extending into the radiocarpal joint. Electronically Signed   By: Melanie  Sanford M.D.   OnNarda Rutherford: 03/27/2022 00:04   CT HEAD WO CONTRAST (5MM)  Result Date: 03/26/2022 CLINICAL DATA:  Trauma. EXAM: CT HEAD WITHOUT CONTRAST CT CERVICAL SPINE WITHOUT CONTRAST TECHNIQUE: Multidetector CT imaging of the head and cervical spine was performed following the standard protocol without intravenous contrast. Multiplanar CT image reconstructions of the cervical spine were also generated. RADIATION DOSE REDUCTION: This exam was performed according to the departmental dose-optimization program which includes automated exposure control, adjustment of the mA and/or kV according to patient size and/or use of iterative reconstruction technique. COMPARISON:   Cervical spine CT dated 04/28/2017. FINDINGS: CT HEAD FINDINGS Brain: The ventricles and sulci are appropriate size for the patient's age. The gray-white matter discrimination is preserved. There is no acute intracranial hemorrhage. No mass effect or midline shift. No extra-axial fluid collection. Vascular: No hyperdense vessel or unexpected calcification. Skull: Normal. Negative for fracture or focal lesion. Sinuses/Orbits: No acute finding. Other: None CT CERVICAL SPINE FINDINGS Alignment: No acute subluxation. There is reversal of normal cervical lordosis which may be positional or due to muscle spasm. Skull base and vertebrae: No acute fracture. Soft tissues and spinal canal: No prevertebral fluid or swelling. No visible canal hematoma. Disc levels:  No acute findings. Upper chest: Negative. Other: None IMPRESSION: 1. No acute intracranial pathology. 2. No acute cervical spine fracture or subluxation. Reversal of normal cervical lordosis which may be positional or due to muscle spasm. Electronically Signed   By: Elgie CollardArash  Radparvar M.D.   On: 03/26/2022 23:47   CT Cervical Spine Wo Contrast  Result Date: 03/26/2022 CLINICAL DATA:  Trauma. EXAM: CT HEAD WITHOUT CONTRAST CT CERVICAL SPINE WITHOUT CONTRAST TECHNIQUE: Multidetector CT imaging of the head and cervical spine was performed following the standard protocol without intravenous contrast. Multiplanar CT image reconstructions of the cervical spine were also generated. RADIATION DOSE REDUCTION: This exam was performed according to the departmental dose-optimization program which includes automated exposure control, adjustment of the mA and/or kV according to patient size and/or use of iterative reconstruction technique. COMPARISON:  Cervical spine CT dated 04/28/2017. FINDINGS: CT HEAD FINDINGS Brain: The ventricles and sulci are appropriate size for the patient's age. The gray-white matter discrimination is preserved. There is no acute intracranial  hemorrhage. No mass effect or midline shift. No extra-axial fluid collection. Vascular: No hyperdense vessel or unexpected calcification. Skull: Normal. Negative for fracture or focal lesion. Sinuses/Orbits: No acute finding. Other: None CT CERVICAL SPINE FINDINGS Alignment: No acute subluxation. There is reversal of normal cervical lordosis which may be positional or due to muscle spasm. Skull base and vertebrae: No acute fracture. Soft tissues and spinal canal: No prevertebral fluid or swelling. No visible canal hematoma. Disc levels:  No acute findings. Upper chest: Negative. Other: None IMPRESSION: 1. No acute intracranial pathology. 2. No acute cervical spine fracture or subluxation. Reversal of normal cervical lordosis which may be positional or due to muscle spasm. Electronically Signed   By: Elgie CollardArash  Radparvar M.D.   On: 03/26/2022 23:47     PROCEDURES:  Critical Care performed: No   CRITICAL CARE  Performed by: Rochele Raring   Total critical care time: 0 minutes  Critical care time was exclusive of separately billable procedures and treating other patients.  Critical care was necessary to treat or prevent imminent or life-threatening deterioration.  Critical care was time spent personally by me on the following activities: development of treatment plan with patient and/or surrogate as well as nursing, discussions with consultants, evaluation of patient's response to treatment, examination of patient, obtaining history from patient or surrogate, ordering and performing treatments and interventions, ordering and review of laboratory studies, ordering and review of radiographic studies, pulse oximetry and re-evaluation of patient's condition.  SPLINT APPLICATION  Authorized by: Baxter Hire Kadynce Bonds Consent: Verbal consent obtained. Risks and benefits: risks, benefits and alternatives were discussed Consent given by: patient Splint applied by:  technician Location details: Right wrist Splint  type: Sugar-tong Supplies used: Ortho-Glass Post-procedure: The splinted body part was neurovascularly unchanged following the procedure. Patient tolerance: Patient tolerated the procedure well with no immediate complications.     Procedures    IMPRESSION / MDM / ASSESSMENT AND PLAN / ED COURSE  I reviewed the triage vital signs and the nursing notes.  Patient here after right wrist injury.  The patient is on the cardiac monitor to evaluate for evidence of arrhythmia and/or significant heart rate changes.   DIFFERENTIAL DIAGNOSIS (includes but not limited to):   Right wrist fracture, dislocation, contusion, sprain, head injury, concussion, less likely intracranial hemorrhage, skull fracture, cervical spine fracture  Patient's presentation is most consistent with acute complicated illness / injury requiring diagnostic workup.  PLAN: CT head, cervical spine obtained in triage.  X-ray of the right upper extremity obtained from triage.  Imaging reviewed and interpreted by myself and the radiologist.  Patient has a mildly displaced transverse distal radial fracture.  She is neurovascular intact distally.  Will provide with pain medication and placed in a sugar-tong splint and sling.  We will give outpatient orthopedic and hand follow-up information.  Provided with prescriptions for pain and nausea medicine.  Discussed importance of rest and elevation.   MEDICATIONS GIVEN IN ED: Medications  oxyCODONE-acetaminophen (PERCOCET/ROXICET) 5-325 MG per tablet 2 tablet (2 tablets Oral Given 03/27/22 0440)  ibuprofen (ADVIL) tablet 800 mg (800 mg Oral Given 03/27/22 0440)  ondansetron (ZOFRAN-ODT) disintegrating tablet 4 mg (4 mg Oral Given 03/27/22 0440)     ED COURSE:  At this time, I do not feel there is any life-threatening condition present. I reviewed all nursing notes, vitals, pertinent previous records.  All lab and urine results, EKGs, imaging ordered have been independently reviewed and  interpreted by myself.  I reviewed all available radiology reports from any imaging ordered this visit.  Based on my assessment, I feel the patient is safe to be discharged home without further emergent workup and can continue workup as an outpatient as needed. Discussed all findings, treatment plan as well as usual and customary return precautions.  They verbalize understanding and are comfortable with this plan.  Outpatient follow-up has been provided as needed.  All questions have been answered.    CONSULTS: No emergent orthopedic consult needed at this time.  Patient is appropriate for outpatient management.   OUTSIDE RECORDS REVIEWED: Reviewed patient's last office visit with Lillette Boxer on 12/12/2021.       FINAL CLINICAL IMPRESSION(S) / ED DIAGNOSES   Final diagnoses:  Right wrist fracture, closed, initial encounter     Rx / DC Orders   ED Discharge Orders  Ordered    oxyCODONE-acetaminophen (PERCOCET) 5-325 MG tablet  Every 6 hours PRN        03/27/22 0544    ondansetron (ZOFRAN-ODT) 4 MG disintegrating tablet  Every 6 hours PRN        03/27/22 0544    ibuprofen (ADVIL) 800 MG tablet  Every 8 hours PRN        03/27/22 0544             Note:  This document was prepared using Dragon voice recognition software and may include unintentional dictation errors.   Hobert Poplaski, Layla Maw, DO 03/27/22 1125    Nekeisha Aure, Layla Maw, DO 03/27/22 1125
